# Patient Record
Sex: Male | Born: 1960 | Race: White | Hispanic: No | Marital: Married | Smoking: Never smoker
Health system: Southern US, Community
[De-identification: ages and names within clinical notes are randomized; demographics above are authoritative.]

## PROBLEM LIST (undated history)

## (undated) DIAGNOSIS — I10 Essential (primary) hypertension: Secondary | ICD-10-CM

## (undated) DIAGNOSIS — R519 Headache, unspecified: Secondary | ICD-10-CM

## (undated) DIAGNOSIS — I2510 Atherosclerotic heart disease of native coronary artery without angina pectoris: Secondary | ICD-10-CM

## (undated) DIAGNOSIS — K219 Gastro-esophageal reflux disease without esophagitis: Secondary | ICD-10-CM

## (undated) DIAGNOSIS — M1990 Unspecified osteoarthritis, unspecified site: Secondary | ICD-10-CM

## (undated) DIAGNOSIS — E785 Hyperlipidemia, unspecified: Secondary | ICD-10-CM

## (undated) DIAGNOSIS — C61 Malignant neoplasm of prostate: Secondary | ICD-10-CM

## (undated) DIAGNOSIS — I499 Cardiac arrhythmia, unspecified: Secondary | ICD-10-CM

## (undated) HISTORY — DX: Hyperlipidemia, unspecified: E78.5

## (undated) HISTORY — PX: OTHER SURGICAL HISTORY: SHX169

## (undated) HISTORY — PX: WISDOM TOOTH EXTRACTION: SHX21

## (undated) HISTORY — PX: PROSTATE BIOPSY: SHX241

## (undated) HISTORY — DX: Atherosclerotic heart disease of native coronary artery without angina pectoris: I25.10

---

## 2003-03-04 ENCOUNTER — Encounter: Payer: Self-pay | Admitting: Family Medicine

## 2003-03-04 ENCOUNTER — Ambulatory Visit (HOSPITAL_COMMUNITY): Admission: RE | Admit: 2003-03-04 | Discharge: 2003-03-04 | Payer: Self-pay | Admitting: Family Medicine

## 2008-12-29 ENCOUNTER — Encounter: Admission: RE | Admit: 2008-12-29 | Discharge: 2008-12-29 | Payer: Self-pay | Admitting: Family Medicine

## 2009-12-28 ENCOUNTER — Encounter: Admission: RE | Admit: 2009-12-28 | Discharge: 2009-12-28 | Payer: Self-pay | Admitting: Family Medicine

## 2013-12-25 ENCOUNTER — Emergency Department (HOSPITAL_COMMUNITY)
Admission: EM | Admit: 2013-12-25 | Discharge: 2013-12-25 | Disposition: A | Discharge status: Home / Self Care | Payer: BC Managed Care – PPO | Attending: Emergency Medicine | Admitting: Emergency Medicine

## 2013-12-25 ENCOUNTER — Emergency Department (HOSPITAL_COMMUNITY): Payer: BC Managed Care – PPO

## 2013-12-25 ENCOUNTER — Encounter (HOSPITAL_COMMUNITY): Payer: Self-pay | Admitting: Emergency Medicine

## 2013-12-25 DIAGNOSIS — S0100XA Unspecified open wound of scalp, initial encounter: Secondary | ICD-10-CM | POA: Insufficient documentation

## 2013-12-25 DIAGNOSIS — Z79899 Other long term (current) drug therapy: Secondary | ICD-10-CM | POA: Insufficient documentation

## 2013-12-25 DIAGNOSIS — Z23 Encounter for immunization: Secondary | ICD-10-CM | POA: Insufficient documentation

## 2013-12-25 DIAGNOSIS — Y929 Unspecified place or not applicable: Secondary | ICD-10-CM | POA: Insufficient documentation

## 2013-12-25 DIAGNOSIS — R112 Nausea with vomiting, unspecified: Secondary | ICD-10-CM | POA: Insufficient documentation

## 2013-12-25 DIAGNOSIS — Y939 Activity, unspecified: Secondary | ICD-10-CM | POA: Insufficient documentation

## 2013-12-25 DIAGNOSIS — W208XXA Other cause of strike by thrown, projected or falling object, initial encounter: Secondary | ICD-10-CM | POA: Insufficient documentation

## 2013-12-25 DIAGNOSIS — I10 Essential (primary) hypertension: Secondary | ICD-10-CM | POA: Insufficient documentation

## 2013-12-25 DIAGNOSIS — S0101XA Laceration without foreign body of scalp, initial encounter: Secondary | ICD-10-CM

## 2013-12-25 HISTORY — DX: Cardiac arrhythmia, unspecified: I49.9

## 2013-12-25 HISTORY — DX: Essential (primary) hypertension: I10

## 2013-12-25 MED ORDER — OXYCODONE-ACETAMINOPHEN 5-325 MG PO TABS: 2.0000 | ORAL_TABLET | ORAL | Status: DC | PRN

## 2013-12-25 MED ORDER — MORPHINE SULFATE 4 MG/ML IJ SOLN
4.0000 mg | Freq: Once | INTRAMUSCULAR | Status: AC
  Administered 2013-12-25: 4 mg via INTRAVENOUS
  Filled 2013-12-25: qty 1

## 2013-12-25 MED ORDER — TETANUS-DIPHTHERIA TOXOIDS TD 5-2 LFU IM INJ
0.5000 mL | INJECTION | Freq: Once | INTRAMUSCULAR | Status: AC
  Administered 2013-12-25: 0.5 mL via INTRAMUSCULAR
  Filled 2013-12-25: qty 0.5

## 2013-12-25 MED ORDER — ONDANSETRON HCL 4 MG/2ML IJ SOLN
4.0000 mg | Freq: Once | INTRAMUSCULAR | Status: AC
  Administered 2013-12-25: 4 mg via INTRAVENOUS
  Filled 2013-12-25: qty 2

## 2013-12-25 MED ORDER — SODIUM CHLORIDE 0.9 % IV SOLN: INTRAVENOUS | Status: DC

## 2013-12-25 MED ORDER — SODIUM CHLORIDE 0.9 % IV BOLUS (SEPSIS)
1000.0000 mL | Freq: Once | INTRAVENOUS | Status: AC
  Administered 2013-12-25: 1000 mL via INTRAVENOUS

## 2013-12-25 MED ORDER — METHOCARBAMOL 500 MG PO TABS: 500.0000 mg | ORAL_TABLET | Freq: Two times a day (BID) | ORAL | Status: DC

## 2013-12-25 NOTE — ED Notes (Signed)
Per EMS, pt was hit in head with tree limb. Laceration with skull showing, bleeding controlled at this time. No LOC, alert and oriented x4.

## 2013-12-25 NOTE — ED Provider Notes (Signed)
LACERATION REPAIR Performed by: Sherrie George Consent: Verbal consent obtained. Risks and benefits: risks, benefits and alternatives were discussed Patient identity confirmed: provided demographic data Time out performed prior to procedure Prepped and Draped in normal sterile fashion Wound explored Laceration Location: LEFT scalp and Calvarium  Laceration Length: 14cm No Foreign Bodies seen or palpated Anesthesia: local infiltration Local anesthetic: lidocaine 2% w/ epinephrine Anesthetic total: 15 ml Irrigation method: syringe Amount of cleaning: copious  Calvarium closure: 4.0 Vicryl x 6 simple interrupted sutures  Skin closure: Staples Number of sutures or staples: 21 staples Patient tolerance: Patient tolerated the procedure well with no immediate complications.       Sherrie George, PA-C 12/25/13 [phone]

## 2013-12-25 NOTE — Discharge Instructions (Signed)
Have staples removed in 7-10 days Staple Wound Closure Staples are used to help a wound heal faster by holding the edges of the wound together. HOME CARE  Keep the area around the staples clean and dry.  Rest and raise (elevate) the injured part above the level of your heart.  See your doctor for a follow-up check of the wound.  See your doctor to have the staples removed.  Clean the wound daily with water.  Do not soak the wound in water for long periods of time.  Let air reach the wound as it heals. GET HELP RIGHT AWAY IF:  Head Injury, Adult You have received a head injury. It does not appear serious at this time. Headaches and vomiting are common following head injury. It should be easy to awaken from sleeping. Sometimes it is necessary for you to stay in the emergency department for a while for observation. Sometimes admission to the hospital may be needed. After injuries such as yours, most problems occur within the first 24 hours, but side effects may occur up to 7 10 days after the injury. It is important for you to carefully monitor your condition and contact your health care provider or seek immediate medical care if there is a change in your condition. WHAT ARE THE TYPES OF HEAD INJURIES? Head injuries can be as minor as a bump. Some head injuries can be more severe. More severe head injuries include:  A jarring injury to the brain (concussion).  A bruise of the brain (contusion). This mean there is bleeding in the brain that can cause swelling.  A cracked skull (skull fracture).  Bleeding in the brain that collects, clots, and forms a bump (hematoma). WHAT CAUSES A HEAD INJURY? A serious head injury is most likely to happen to someone who is in a car wreck and is not wearing a seat belt. Other causes of major head injuries include bicycle or motorcycle accidents, sports injuries, and falls. HOW ARE HEAD INJURIES DIAGNOSED? A complete history of the event leading to the  injury and your current symptoms will be helpful in diagnosing head injuries. Many times, pictures of the brain, such as CT or MRI are needed to see the extent of the injury. Often, an overnight hospital stay is necessary for observation.  WHEN SHOULD I SEEK IMMEDIATE MEDICAL CARE?  You should get help right away if:  You have confusion or drowsiness.  You feel sick to your stomach (nauseous) or have continued, forceful vomiting.  You have dizziness or unsteadiness that is getting worse.  You have severe, continued headaches not relieved by medicine. Only take over-the-counter or prescription medicines for pain, fever, or discomfort as directed by your health care provider.  You do not have normal function of the arms or legs or are unable to walk.  You notice changes in the black spots in the center of the colored part of your eye (pupil).  You have a clear or bloody fluid coming from your nose or ears.  You have a loss of vision. During the next 24 hours after the injury, you must stay with someone who can watch you for the warning signs. This person should contact local emergency services (911 in the [place] if you have seizures, you become unconscious, or you are unable to wake up. HOW CAN I PREVENT A HEAD INJURY IN THE FUTURE? The most important factor for preventing major head injuries is avoiding motor vehicle accidents. To minimize the potential for damage  to your head, it is crucial to wear seat belts while riding in motor vehicles. Wearing helmets while bike riding and playing collision sports (like football) is also helpful. Also, avoiding dangerous activities around the house will further help reduce your risk of head injury.  WHEN CAN I RETURN TO NORMAL ACTIVITIES AND ATHLETICS? You should be reevaluated by your health care provider before returning to these activities. If you have any of the following symptoms, you should not return to activities or contact sports until 1 week  after the symptoms have stopped:  Persistent headache.  Dizziness or vertigo.  Poor attention and concentration.  Confusion.  Memory problems.  Nausea or vomiting.  Fatigue or tire easily.  Irritability.  Intolerant of bright lights or loud noises.  Anxiety or depression.  Disturbed sleep. MAKE SURE YOU:   Understand these instructions.  Will watch your condition.  Will get help right away if you are not doing well or get worse. Document Released: 10/07/2005 Document Revised: 07/28/2013 Document Reviewed: 06/14/2013 [facility] Patient Information 2014 [place].   You have redness or puffiness around the wound.  You have a red line going away from the wound.  You have more pain or tenderness.  You have yellowish-white fluid (pus) coming from the wound.  Your wound does not stay together after the staples have been taken out.  You see something coming out of the wound, such as wood or glass.  You have problems moving the injured area.  You have a fever or lasting symptoms for more than 2-3 days.  You have a fever and your symptoms suddenly get worse. MAKE SURE YOU:   Understand these instructions.  Will watch this condition.  Will get help right away if you are not doing well or get worse. Document Released: 07/16/2008 Document Revised: 07/01/2012 Document Reviewed: 04/19/2012 [facility] Patient Information 2014 [place].

## 2013-12-25 NOTE — ED Notes (Signed)
MD at bedside. 

## 2013-12-25 NOTE — ED Provider Notes (Signed)
CSN: [id number]     Arrival date & time 12/25/13  1546 History   First MD Initiated Contact with Patient 12/25/13 1556     Chief Complaint  Patient presents with  . Head Laceration     (Consider location/radiation/quality/duration/timing/severity/associated sxs/prior Treatment) Patient is a 53 y.o. male presenting with scalp laceration. The history is provided by the patient and the spouse.  Head Laceration   patient here complaining of left parietal laceration to scalp after being struck by a tree branch. No loss of consciousness. Complains of bleeding and pain characterized as sharp and worse with movement. Denies any weakness in his upper lower extremities. Denies any chest abdomen or pelvis pain. Denies any focal neurological deficits. No blurred vision. He has had nausea and vomiting since the accident. Denies visual changes. EMS was called and direct pressure bandage was placed and he was transported here.  Past Medical History  Diagnosis Date  . Hypertension   . Irregular heart beat    History reviewed. No pertinent past surgical history. No family history on file. History  Substance Use Topics  . Smoking status: Never Smoker   . Smokeless tobacco: Not on file  . Alcohol Use: No    Review of Systems  All other systems reviewed and are negative.      Allergies  Review of patient's allergies indicates not on file.  Home Medications   Current Outpatient Rx  Name  Route  Sig  Dispense  Refill  . atorvastatin (LIPITOR) 20 MG tablet   Oral   Take 20 mg by mouth daily.         . Chlorphen-Pseudoephed-APAP (TYLENOL ALLERGY SINUS PO)   Oral   Take 1 tablet by mouth every 4 (four) hours as needed (allergies).         Marland Kitchen glucosamine-chondroitin 500-400 MG tablet   Oral   Take 1 tablet by mouth daily.         Marland Kitchen losartan-hydrochlorothiazide (HYZAAR) 50-12.5 MG per tablet   Oral   Take 1 tablet by mouth daily.          BP 149/85  Pulse 83  Resp 16  SpO2  96% Physical Exam  Nursing note and vitals reviewed. Constitutional: He is oriented to person, place, and time. He appears well-developed and well-nourished.  Non-toxic appearance. No distress.  HENT:  Head: Normocephalic and atraumatic.    Eyes: Conjunctivae, EOM and lids are normal. Pupils are equal, round, and reactive to light.  Neck: Normal range of motion. Neck supple. No spinous process tenderness and no muscular tenderness present. No tracheal deviation present. No mass present.  Cardiovascular: Normal rate, regular rhythm and normal heart sounds.  Exam reveals no gallop.   No murmur heard. Pulmonary/Chest: Effort normal and breath sounds normal. No stridor. No respiratory distress. He has no decreased breath sounds. He has no wheezes. He has no rhonchi. He has no rales.  Abdominal: Soft. Normal appearance and bowel sounds are normal. He exhibits no distension. There is no tenderness. There is no rebound and no CVA tenderness.  Musculoskeletal: Normal range of motion. He exhibits no edema and no tenderness.  Neurological: He is alert and oriented to person, place, and time. He has normal strength. No cranial nerve deficit or sensory deficit. GCS eye subscore is 4. GCS verbal subscore is 5. GCS motor subscore is 6.  Skin: Skin is warm and dry. No abrasion and no rash noted.  Psychiatric: He has a normal mood and affect. His  speech is normal and behavior is normal.    ED Course  Procedures (including critical care time) Labs Review Labs Reviewed - No data to display Imaging Review No results found.   EKG Interpretation None      MDM   Final diagnoses:  None    Patient given tetanus up-to-date. Was given Zofran and morphine for his pain and nausea. He feels better. Repeat neurological exams remained stable. Laceration repaired by physician assistant please see his note. Patient to be discharged with prescription for pain and instructions on staple removal.    Leota Jacobsen, MD 12/25/13 [phone]

## 2013-12-26 NOTE — ED Provider Notes (Signed)
Medical screening examination/treatment/procedure(s) were conducted as a shared visit with non-physician practitioner(s) and myself.  I personally evaluated the patient during the encounter.   EKG Interpretation None       Leota Jacobsen, MD 12/26/13 [phone]

## 2015-03-09 ENCOUNTER — Encounter: Payer: Self-pay | Admitting: *Deleted

## 2015-03-13 ENCOUNTER — Encounter: Payer: Self-pay | Admitting: Interventional Cardiology

## 2015-03-13 ENCOUNTER — Ambulatory Visit (INDEPENDENT_AMBULATORY_CARE_PROVIDER_SITE_OTHER): Payer: BLUE CROSS/BLUE SHIELD | Admitting: Interventional Cardiology

## 2015-03-13 VITALS — BP 132/86 | HR 69 | Ht 68.0 in | Wt 168.0 lb

## 2015-03-13 DIAGNOSIS — I491 Atrial premature depolarization: Secondary | ICD-10-CM | POA: Diagnosis not present

## 2015-03-13 DIAGNOSIS — E785 Hyperlipidemia, unspecified: Secondary | ICD-10-CM

## 2015-03-13 DIAGNOSIS — R002 Palpitations: Secondary | ICD-10-CM

## 2015-03-13 DIAGNOSIS — I10 Essential (primary) hypertension: Secondary | ICD-10-CM

## 2015-03-13 DIAGNOSIS — R0789 Other chest pain: Secondary | ICD-10-CM | POA: Diagnosis not present

## 2015-03-13 NOTE — Patient Instructions (Signed)
Medication Instructions:  Same  Labwork: None  Testing/Procedures: Your physician has requested that you have an exercise tolerance test. For further information please visit [email or web address]. Please also follow instruction sheet, as given.  Follow-Up: To be determined after Treadmill test.

## 2015-03-13 NOTE — Progress Notes (Signed)
Patient ID: Stephen Ayers, male   DOB: 1960/11/26, 54 y.o.   MRN: [id number]     Cardiology Office Note   Date:  03/13/2015   ID:  Stephen Ayers, DOB February 13, 1961, MRN [id number]  PCP:  Stephen Frees, MD    No chief complaint on file. chest pain   Wt Readings from Last 3 Encounters:  03/13/15 168 lb (76.204 kg)       History of Present Illness: Stephen Ayers is a 54 y.o. male  Who has had HTN, hyperlipidemia and symptomatic PACs, several times a day.  He has had palpitations that last a few seconds causing lightheadedness and fatigue.  No syncope. He is not close to passing out.  He has had some left sided chest pain, not related to exertion.  Walks up stairs without problems.  THere is some tingling in the left wrist and occasional jaw pain.  CP is frequent, several times a week.  It lasts several hours at a time.  THere is a lot of stress at work and this seems to contribute.  He is taking ecotrin. No bleeding issues.  Biggest concern is the chest pain that leaves him tired.  Father had CABG at age 57.  He dies at 26.  He stopped cardio exercises 4-5 weeks ago due to these sx.  He has had PACs with exercise in the past.     Past Medical History  Diagnosis Date  . Hypertension   . Irregular heart beat     History reviewed. No pertinent past surgical history.   Current Outpatient Prescriptions  Medication Sig Dispense Refill  . Aspirin (ECOTRIN PO) Take by mouth daily.    Marland Kitchen atorvastatin (LIPITOR) 20 MG tablet Take 20 mg by mouth daily.    . Chlorphen-Pseudoephed-APAP (TYLENOL ALLERGY SINUS PO) Take 1 tablet by mouth every 4 (four) hours as needed (allergies).    Marland Kitchen glucosamine-chondroitin 500-400 MG tablet Take 1 tablet by mouth daily.    Marland Kitchen losartan-hydrochlorothiazide (HYZAAR) 50-12.5 MG per tablet Take 1 tablet by mouth daily.    . methocarbamol (ROBAXIN) 500 MG tablet Take 1 tablet (500 mg total) by mouth 2 (two) times daily. 20 tablet 0   No current  facility-administered medications for this visit.    Allergies:   Review of patient's allergies indicates no known allergies.    Social History:  The patient  reports that he has never smoked. He does not have any smokeless tobacco history on file. He reports that he does not drink alcohol or use illicit drugs.   Family History:  The patient's *family history includes Kidney failure in his father; Mitral valve prolapse in his mother.    ROS:  Please see the history of present illness.   Otherwise, review of systems are positive for chest pain.   All other systems are reviewed and negative.    PHYSICAL EXAM: VS:  BP 132/86 mmHg  Pulse 69  Ht 5\' 8"  (1.727 m)  Wt 168 lb (76.204 kg)  BMI 25.55 kg/m2 , BMI Body mass index is 25.55 kg/(m^2). GEN: Well nourished, well developed, in no acute distress HEENT: normal Neck: no JVD, carotid bruits, or masses Cardiac: *RRR; no murmurs, rubs, or gallops,no edema  Respiratory:  clear to auscultation bilaterally, normal work of breathing GI: soft, nontender, nondistended, + BS MS: no deformity or atrophy Skin: warm and dry, no rash Neuro:  Strength and sensation are intact Psych: euthymic mood, full affect   EKG:  The ekg ordered today demonstrates normal   Recent Labs: No results found for requested labs within last 365 days.   Lipid Panel No results found for: CHOL, TRIG, HDL, CHOLHDL, VLDL, LDLCALC, LDLDIRECT   Other studies Reviewed: Additional studies/ records that were reviewed today with results demonstrating: Exercise treadmill test in 2013 showed no ischemia. He walked for 9 minutes. No arrhythmia. Echocardiogram in 2011 showed normal LV function.  The echo was done at [facility].   ASSESSMENT AND PLAN:  1. Chest pain: Several atypical features including the fact that it is nonexertional. He walks stairs without any problems. His prior treadmill test in 2013 was negative for ischemia.  Plan for exercise treadmill  test. 2. Palpitations: He has had PACs in the past. Now having a few seconds of sustained palpitations. If his symptoms persist after his treadmill test, will plan for a 30 day event monitor. 3. Hyperlipidemia: Continue atorvastatin. 4. Hypertension: Continue current medicines. Well controlled.   Current medicines are reviewed at length with the patient today.  The patient concerns regarding his medicines were addressed.  The following changes have been made:  No change  Labs/ tests ordered today include: none No orders of the defined types were placed in this encounter.    Recommend 150 minutes/week of aerobic exercise Low fat, low carb, high fiber diet recommended  Disposition:   FU for ETT   Teresita Madura., MD  03/13/2015 12:33 PM    [facility] [place], [place], [place]  [address] Phone: [phone]; Fax: [phone]

## 2015-04-06 ENCOUNTER — Telehealth (HOSPITAL_COMMUNITY): Payer: Self-pay

## 2015-04-06 NOTE — Telephone Encounter (Signed)
Encounter complete. 

## 2015-04-07 ENCOUNTER — Telehealth (HOSPITAL_COMMUNITY): Payer: Self-pay

## 2015-04-07 NOTE — Telephone Encounter (Signed)
Encounter complete. 

## 2015-04-11 ENCOUNTER — Ambulatory Visit (HOSPITAL_COMMUNITY)
Admission: RE | Admit: 2015-04-11 | Discharge: 2015-04-11 | Disposition: A | Discharge status: Home / Self Care | Payer: BLUE CROSS/BLUE SHIELD | Source: Ambulatory Visit | Attending: Cardiovascular Disease | Admitting: Cardiovascular Disease

## 2015-04-11 DIAGNOSIS — R0789 Other chest pain: Secondary | ICD-10-CM | POA: Diagnosis present

## 2015-04-12 LAB — EXERCISE TOLERANCE TEST
CHL CUP MPHR: 164 {beats}/min
CSEPED: 12 min
CSEPEW: 13.7 METS
CSEPPHR: 187 {beats}/min
Exercise duration (sec): 1 s
Percent HR: 114 %
RPE: 14
Rest HR: 71 {beats}/min

## 2015-04-14 ENCOUNTER — Encounter: Payer: Self-pay | Admitting: Interventional Cardiology

## 2016-02-26 DIAGNOSIS — N401 Enlarged prostate with lower urinary tract symptoms: Secondary | ICD-10-CM | POA: Diagnosis not present

## 2016-02-26 DIAGNOSIS — N4889 Other specified disorders of penis: Secondary | ICD-10-CM | POA: Diagnosis not present

## 2016-02-26 DIAGNOSIS — R3914 Feeling of incomplete bladder emptying: Secondary | ICD-10-CM | POA: Diagnosis not present

## 2016-02-26 DIAGNOSIS — R3912 Poor urinary stream: Secondary | ICD-10-CM | POA: Diagnosis not present

## 2016-02-26 DIAGNOSIS — Z0000 Encounter for general adult medical examination without abnormal findings: Secondary | ICD-10-CM | POA: Diagnosis not present

## 2016-04-03 DIAGNOSIS — N410 Acute prostatitis: Secondary | ICD-10-CM | POA: Diagnosis not present

## 2016-04-12 DIAGNOSIS — L570 Actinic keratosis: Secondary | ICD-10-CM | POA: Diagnosis not present

## 2016-06-03 DIAGNOSIS — M25512 Pain in left shoulder: Secondary | ICD-10-CM | POA: Diagnosis not present

## 2016-06-03 DIAGNOSIS — I10 Essential (primary) hypertension: Secondary | ICD-10-CM | POA: Diagnosis not present

## 2016-06-03 DIAGNOSIS — Z125 Encounter for screening for malignant neoplasm of prostate: Secondary | ICD-10-CM | POA: Diagnosis not present

## 2016-06-03 DIAGNOSIS — E782 Mixed hyperlipidemia: Secondary | ICD-10-CM | POA: Diagnosis not present

## 2016-09-30 DIAGNOSIS — N401 Enlarged prostate with lower urinary tract symptoms: Secondary | ICD-10-CM | POA: Diagnosis not present

## 2016-11-04 DIAGNOSIS — M25511 Pain in right shoulder: Secondary | ICD-10-CM | POA: Diagnosis not present

## 2016-11-13 DIAGNOSIS — M19011 Primary osteoarthritis, right shoulder: Secondary | ICD-10-CM | POA: Diagnosis not present

## 2016-11-13 DIAGNOSIS — M19012 Primary osteoarthritis, left shoulder: Secondary | ICD-10-CM | POA: Diagnosis not present

## 2016-11-18 DIAGNOSIS — R972 Elevated prostate specific antigen [PSA]: Secondary | ICD-10-CM | POA: Diagnosis not present

## 2016-11-18 DIAGNOSIS — R3911 Hesitancy of micturition: Secondary | ICD-10-CM | POA: Diagnosis not present

## 2016-11-18 DIAGNOSIS — R3912 Poor urinary stream: Secondary | ICD-10-CM | POA: Diagnosis not present

## 2016-11-18 DIAGNOSIS — N401 Enlarged prostate with lower urinary tract symptoms: Secondary | ICD-10-CM | POA: Diagnosis not present

## 2016-12-04 DIAGNOSIS — Z23 Encounter for immunization: Secondary | ICD-10-CM | POA: Diagnosis not present

## 2016-12-04 DIAGNOSIS — E782 Mixed hyperlipidemia: Secondary | ICD-10-CM | POA: Diagnosis not present

## 2016-12-04 DIAGNOSIS — I10 Essential (primary) hypertension: Secondary | ICD-10-CM | POA: Diagnosis not present

## 2017-02-12 DIAGNOSIS — L570 Actinic keratosis: Secondary | ICD-10-CM | POA: Diagnosis not present

## 2017-05-13 DIAGNOSIS — L570 Actinic keratosis: Secondary | ICD-10-CM | POA: Diagnosis not present

## 2017-06-03 DIAGNOSIS — Z125 Encounter for screening for malignant neoplasm of prostate: Secondary | ICD-10-CM | POA: Diagnosis not present

## 2017-06-03 DIAGNOSIS — I10 Essential (primary) hypertension: Secondary | ICD-10-CM | POA: Diagnosis not present

## 2017-06-03 DIAGNOSIS — E782 Mixed hyperlipidemia: Secondary | ICD-10-CM | POA: Diagnosis not present

## 2017-07-07 DIAGNOSIS — R972 Elevated prostate specific antigen [PSA]: Secondary | ICD-10-CM | POA: Diagnosis not present

## 2017-07-07 DIAGNOSIS — R399 Unspecified symptoms and signs involving the genitourinary system: Secondary | ICD-10-CM | POA: Diagnosis not present

## 2017-08-06 DIAGNOSIS — Z23 Encounter for immunization: Secondary | ICD-10-CM | POA: Diagnosis not present

## 2017-09-04 DIAGNOSIS — Z125 Encounter for screening for malignant neoplasm of prostate: Secondary | ICD-10-CM | POA: Diagnosis not present

## 2017-10-03 DIAGNOSIS — L570 Actinic keratosis: Secondary | ICD-10-CM | POA: Diagnosis not present

## 2017-12-04 DIAGNOSIS — E782 Mixed hyperlipidemia: Secondary | ICD-10-CM | POA: Diagnosis not present

## 2017-12-04 DIAGNOSIS — I10 Essential (primary) hypertension: Secondary | ICD-10-CM | POA: Diagnosis not present

## 2018-02-20 DIAGNOSIS — T162XXA Foreign body in left ear, initial encounter: Secondary | ICD-10-CM | POA: Diagnosis not present

## 2018-02-20 DIAGNOSIS — H6122 Impacted cerumen, left ear: Secondary | ICD-10-CM | POA: Diagnosis not present

## 2018-02-20 DIAGNOSIS — H9112 Presbycusis, left ear: Secondary | ICD-10-CM | POA: Diagnosis not present

## 2018-06-03 DIAGNOSIS — B356 Tinea cruris: Secondary | ICD-10-CM | POA: Diagnosis not present

## 2018-06-03 DIAGNOSIS — Z125 Encounter for screening for malignant neoplasm of prostate: Secondary | ICD-10-CM | POA: Diagnosis not present

## 2018-06-03 DIAGNOSIS — Z0000 Encounter for general adult medical examination without abnormal findings: Secondary | ICD-10-CM | POA: Diagnosis not present

## 2018-06-03 DIAGNOSIS — E782 Mixed hyperlipidemia: Secondary | ICD-10-CM | POA: Diagnosis not present

## 2018-06-03 DIAGNOSIS — N401 Enlarged prostate with lower urinary tract symptoms: Secondary | ICD-10-CM | POA: Diagnosis not present

## 2018-06-03 DIAGNOSIS — I10 Essential (primary) hypertension: Secondary | ICD-10-CM | POA: Diagnosis not present

## 2018-07-30 DIAGNOSIS — Z23 Encounter for immunization: Secondary | ICD-10-CM | POA: Diagnosis not present

## 2018-08-19 DIAGNOSIS — L570 Actinic keratosis: Secondary | ICD-10-CM | POA: Diagnosis not present

## 2018-12-04 DIAGNOSIS — I10 Essential (primary) hypertension: Secondary | ICD-10-CM | POA: Diagnosis not present

## 2018-12-04 DIAGNOSIS — R972 Elevated prostate specific antigen [PSA]: Secondary | ICD-10-CM | POA: Diagnosis not present

## 2018-12-04 DIAGNOSIS — B356 Tinea cruris: Secondary | ICD-10-CM | POA: Diagnosis not present

## 2018-12-04 DIAGNOSIS — Z23 Encounter for immunization: Secondary | ICD-10-CM | POA: Diagnosis not present

## 2018-12-04 DIAGNOSIS — L570 Actinic keratosis: Secondary | ICD-10-CM | POA: Diagnosis not present

## 2019-06-07 DIAGNOSIS — Z0000 Encounter for general adult medical examination without abnormal findings: Secondary | ICD-10-CM | POA: Diagnosis not present

## 2019-06-07 DIAGNOSIS — E782 Mixed hyperlipidemia: Secondary | ICD-10-CM | POA: Diagnosis not present

## 2019-06-07 DIAGNOSIS — N401 Enlarged prostate with lower urinary tract symptoms: Secondary | ICD-10-CM | POA: Diagnosis not present

## 2019-06-07 DIAGNOSIS — I10 Essential (primary) hypertension: Secondary | ICD-10-CM | POA: Diagnosis not present

## 2019-07-14 DIAGNOSIS — M25551 Pain in right hip: Secondary | ICD-10-CM | POA: Diagnosis not present

## 2019-07-14 DIAGNOSIS — M5431 Sciatica, right side: Secondary | ICD-10-CM | POA: Diagnosis not present

## 2019-07-23 DIAGNOSIS — M25551 Pain in right hip: Secondary | ICD-10-CM | POA: Diagnosis not present

## 2019-07-26 DIAGNOSIS — M25551 Pain in right hip: Secondary | ICD-10-CM | POA: Diagnosis not present

## 2019-07-26 DIAGNOSIS — M1611 Unilateral primary osteoarthritis, right hip: Secondary | ICD-10-CM | POA: Diagnosis not present

## 2019-08-04 DIAGNOSIS — M25551 Pain in right hip: Secondary | ICD-10-CM | POA: Diagnosis not present

## 2019-08-11 DIAGNOSIS — M25551 Pain in right hip: Secondary | ICD-10-CM | POA: Diagnosis not present

## 2019-08-18 DIAGNOSIS — R972 Elevated prostate specific antigen [PSA]: Secondary | ICD-10-CM | POA: Diagnosis not present

## 2019-08-18 DIAGNOSIS — N401 Enlarged prostate with lower urinary tract symptoms: Secondary | ICD-10-CM | POA: Diagnosis not present

## 2019-08-18 DIAGNOSIS — R350 Frequency of micturition: Secondary | ICD-10-CM | POA: Diagnosis not present

## 2019-09-09 DIAGNOSIS — M25551 Pain in right hip: Secondary | ICD-10-CM | POA: Insufficient documentation

## 2019-09-13 DIAGNOSIS — M1611 Unilateral primary osteoarthritis, right hip: Secondary | ICD-10-CM | POA: Diagnosis not present

## 2019-09-13 DIAGNOSIS — M25551 Pain in right hip: Secondary | ICD-10-CM | POA: Diagnosis not present

## 2019-10-07 DIAGNOSIS — R972 Elevated prostate specific antigen [PSA]: Secondary | ICD-10-CM | POA: Diagnosis not present

## 2019-10-07 DIAGNOSIS — C61 Malignant neoplasm of prostate: Secondary | ICD-10-CM | POA: Diagnosis not present

## 2019-11-04 DIAGNOSIS — C61 Malignant neoplasm of prostate: Secondary | ICD-10-CM | POA: Diagnosis not present

## 2019-11-09 ENCOUNTER — Encounter: Payer: Self-pay | Admitting: *Deleted

## 2019-11-22 ENCOUNTER — Encounter: Payer: Self-pay | Admitting: Radiation Oncology

## 2019-11-22 NOTE — Progress Notes (Signed)
GU Location of Tumor / Histology: prostatic adenocarcinoma  If Prostate Cancer, Gleason Score is (3 + 4) and PSA is (5.98). Prostate volume: 33 grams.  Stephen Ayers was referred back to Dr. Jeffie Pollock by Dr. Kenton Kingfisher for PSA of 6.27 on recent labs. He was seen in 2017 and the PSA was 3.3 in May 2017 but came down to 2.29 later that year.  Biopsies of prostate (if applicable) revealed:   Past/Anticipated interventions by urology, if any: Tamsulosin since 2017, prostate biopsy, referral to Dr. Tammi Klippel  Past/Anticipated interventions by medical oncology, if any: no  Weight changes, if any: yes, from not working out    Bowel/Bladder complaints, if any: IPSS 4. SHIM 8. Reports some daytime frequency, nocturia x 1, denies dysuria or hematuria. Denies urinary leakage or incontinence.Denies any bowel complaints.  Nausea/Vomiting, if any: no  Pain issues, if any:  Right hip pain x few month. MRI revealed arthritis. Also, reports right shoulder pain.   SAFETY ISSUES:  Prior radiation? denies  Pacemaker/ICD? denies  Possible current pregnancy? no, male patient  Is the patient on methotrexate? no  Current Complaints / other details:  59 year old male. Single.

## 2019-11-23 ENCOUNTER — Other Ambulatory Visit: Payer: Self-pay

## 2019-11-23 ENCOUNTER — Ambulatory Visit
Admission: RE | Admit: 2019-11-23 | Discharge: 2019-11-23 | Disposition: A | Discharge status: Home / Self Care | Payer: BC Managed Care – PPO | Source: Ambulatory Visit | Attending: Radiation Oncology | Admitting: Radiation Oncology

## 2019-11-23 ENCOUNTER — Encounter: Payer: Self-pay | Admitting: Radiation Oncology

## 2019-11-23 VITALS — Ht 67.0 in | Wt 170.0 lb

## 2019-11-23 DIAGNOSIS — R972 Elevated prostate specific antigen [PSA]: Secondary | ICD-10-CM | POA: Diagnosis not present

## 2019-11-23 DIAGNOSIS — C61 Malignant neoplasm of prostate: Secondary | ICD-10-CM | POA: Insufficient documentation

## 2019-11-23 HISTORY — DX: Malignant neoplasm of prostate: C61

## 2019-11-23 NOTE — Progress Notes (Signed)
Radiation Oncology         [phone] ________________________________  Initial outpatient Consultation - Conducted via Telephone due to current COVID-19 concerns for limiting patient exposure  Name: Stephen Ayers MRN: [id number]  Date: 11/23/2019  DOB: [birth year]  [id number], Gwyndolyn Saxon, MD  Irine Seal, MD   REFERRING PHYSICIAN: Irine Seal, MD  DIAGNOSIS: 59 y.o. gentleman with Stage T1c adenocarcinoma of the prostate with Gleason score of 3+4, and PSA of 5.98.    ICD-10-CM   1. Malignant neoplasm of prostate ([place])  C61     HISTORY OF PRESENT ILLNESS: Stephen Ayers is a 59 y.o. male with a diagnosis of prostate cancer. He has a history of elevated PSA at 3.3, associated with prostatitis and BPH with BOO, initially seen with Dr. Jeffie Pollock at [facility] in 2017 and prescribed tamsulosin. His PSA decreased back to normal at 2.29 with treatment so he did not warrant any further evaluation and has continued in routine follow up with his PCP since that time.  More recently, he was noted to have an elevated PSA of 6.27 by his primary care physician, Dr. Kenton Kingfisher.  Accordingly, he was referred back for evaluation in urology by Dr. Jeffie Pollock on 08/18/2019,  digital rectal examination was performed at that time revealing no nodules. A repeat PSA performed at that visit remained elevated at 5.98. Therefore, the patient proceeded to transrectal ultrasound with 12 biopsies of the prostate on 10/07/2019.  The prostate volume measured 33 cc.  Out of 12 core biopsies, 6 were positive.  The maximum Gleason score was 3+4, and this was seen in the right apex. Additionally, Gleason 3+3 was seen in the right apex lateral (small focus), left mid, left mid lateral (small focus), left apex lateral (small focus), and left base lateral.  The patient reviewed the biopsy results with his urologist and he has kindly been referred today for discussion of potential radiation treatment options.   PREVIOUS RADIATION THERAPY:  No  PAST MEDICAL HISTORY:  Past Medical History:  Diagnosis Date  . Hypertension   . Irregular heart beat   . Prostate cancer ([place])       PAST SURGICAL HISTORY: Past Surgical History:  Procedure Laterality Date  . PROSTATE BIOPSY      FAMILY HISTORY:  Family History  Problem Relation Age of Onset  . Kidney failure Father   . Mitral valve prolapse Mother   . Cancer Paternal Uncle        unknown  . Breast cancer Neg Hx   . Prostate cancer Neg Hx   . Colon cancer Neg Hx   . Pancreatic cancer Neg Hx     SOCIAL HISTORY:  Social History   Socioeconomic History  . Marital status: Single    Spouse name: Not on file  . Number of children: Not on file  . Years of education: Not on file  . Highest education level: Not on file  Occupational History  . Not on file  Tobacco Use  . Smoking status: Never Smoker  . Smokeless tobacco: Never Used  Substance and Sexual Activity  . Alcohol use: No  . Drug use: No  . Sexual activity: Yes  Other Topics Concern  . Not on file  Social History Narrative  . Not on file   Social Determinants of Health   Financial Resource Strain:   . Difficulty of Paying Living Expenses: Not on file  Food Insecurity:   . Worried About Charity fundraiser in the  Last Year: Not on file  . Ran Out of Food in the Last Year: Not on file  Transportation Needs:   . Lack of Transportation (Medical): Not on file  . Lack of Transportation (Non-Medical): Not on file  Physical Activity:   . Days of Exercise per Week: Not on file  . Minutes of Exercise per Session: Not on file  Stress:   . Feeling of Stress : Not on file  Social Connections:   . Frequency of Communication with Friends and Family: Not on file  . Frequency of Social Gatherings with Friends and Family: Not on file  . Attends Religious Services: Not on file  . Active Member of Clubs or Organizations: Not on file  . Attends Archivist Meetings: Not on file  . Marital Status: Not  on file  Intimate Partner Violence:   . Fear of Current or Ex-Partner: Not on file  . Emotionally Abused: Not on file  . Physically Abused: Not on file  . Sexually Abused: Not on file    ALLERGIES: Patient has no known allergies.  MEDICATIONS:  Current Outpatient Medications  Medication Sig Dispense Refill  . atorvastatin (LIPITOR) 20 MG tablet Take 20 mg by mouth daily.    . Chlorphen-Pseudoephed-APAP (TYLENOL ALLERGY SINUS PO) Take 1 tablet by mouth every 4 (four) hours as needed (allergies).    . losartan-hydrochlorothiazide (HYZAAR) 50-12.5 MG per tablet Take 1 tablet by mouth daily.    . naproxen (NAPROSYN) 500 MG tablet naproxen 500 mg tablet  TK 1 T PO Q 12 H WF PRN    . tamsulosin (FLOMAX) 0.4 MG CAPS capsule tamsulosin 0.4 mg capsule    . Aspirin (ECOTRIN PO) Take by mouth daily.     No current facility-administered medications for this encounter.    REVIEW OF SYSTEMS:  On review of systems, the patient reports that he is doing well overall. He denies any chest pain, shortness of breath, cough, fevers, chills, night sweats, unintended weight changes. He denies any bowel disturbances, and denies abdominal pain, nausea or vomiting. He denies any new musculoskeletal or joint aches or pains. He does report chronic right hip pain, which was found to be arthritis on recent MRI. His IPSS was 4 on tamsulosin, indicating mostly controlled urinary symptoms. He reports some daytime frequency and nocturia x1. His SHIM was 8, indicating he has moderate to severe erectile dysfunction. A complete review of systems is obtained and is otherwise negative.    PHYSICAL EXAM:  Wt Readings from Last 3 Encounters:  11/23/19 170 lb (77.1 kg)  03/13/15 168 lb (76.2 kg)   Temp Readings from Last 3 Encounters:  12/25/13 98.1 F (36.7 C) (Oral)   BP Readings from Last 3 Encounters:  03/13/15 132/86  12/25/13 125/72   Pulse Readings from Last 3 Encounters:  03/13/15 69  12/25/13 88   Pain  Assessment Pain Score: 1  Pain Frequency: Constant Pain Loc: Hip(related to arthritis and positional right hip and right shoulder)/10  Physical exam not performed in light of telephone consult visit format.   KPS = 100  100 - Normal; no complaints; no evidence of disease. 90   - Able to carry on normal activity; minor signs or symptoms of disease. 80   - Normal activity with effort; some signs or symptoms of disease. 89   - Cares for self; unable to carry on normal activity or to do active work. 60   - Requires occasional assistance, but is able to  care for most of his personal needs. 50   - Requires considerable assistance and frequent medical care. 25   - Disabled; requires special care and assistance. 62   - Severely disabled; hospital admission is indicated although death not imminent. 34   - Very sick; hospital admission necessary; active supportive treatment necessary. 10   - Moribund; fatal processes progressing rapidly. 0     - Dead  Karnofsky DA, Abelmann WH, Craver LS and Burchenal JH [phone]) The use of the nitrogen mustards in the palliative treatment of carcinoma: with particular reference to bronchogenic carcinoma Cancer 1 [phone]  LABORATORY DATA:  No results found for: WBC, HGB, HCT, MCV, PLT No results found for: NA, K, CL, CO2 No results found for: ALT, AST, GGT, ALKPHOS, BILITOT   RADIOGRAPHY: No results found.    IMPRESSION/PLAN: This visit was conducted via Telephone to spare the patient unnecessary potential exposure in the healthcare setting during the current COVID-19 pandemic. 1. 59 y.o. gentleman with Stage T1c adenocarcinoma of the prostate with Gleason Score of 3+4, and PSA of 5.98. We discussed the patient's workup and outlined the nature of prostate cancer in this setting. The patient's T stage, Gleason's score, and PSA put him into the favorable intermediate risk group. Accordingly, he is eligible for a variety of potential treatment options including  brachytherapy, 5.5 weeks of external radiation or prostatectomy. We discussed the available radiation techniques, and focused on the details and logistics and delivery. We discussed and outlined the risks, benefits, short and long-term effects associated with radiotherapy. We discussed the role of SpaceOAR gel in reducing the rectal toxicity associated with radiotherapy.  He was encouraged to ask questions that were answered to his stated satisfaction.  At the end of the conversation, the patient is interested in moving forward with brachytherapy and use of SpaceOAR gel to reduce rectal toxicity from radiotherapy.  We will share our discussion with Dr. Jeffie Pollock and move forward with scheduling his CT [facility] planning appointment in the near future.  The patient will be contacted by Romie Jumper in our office, who will be working closely with him to coordinate OR scheduling and pre and post procedure appointments, in the near future.  We will contact the pharmaceutical rep to ensure that [place] is available at the time of procedure.  He will have a prostate MRI following his post-seed CT SIM to confirm appropriate distribution of the [place].   Given current concerns for patient exposure during the COVID-19 pandemic, this encounter was conducted via telephone. The patient was notified in advance and was offered a MyChart meeting to allow for face to face communication but unfortunately reported that he did not have the appropriate resources/technology to support such a visit and instead preferred to proceed with telephone consult. The patient has given verbal consent for this type of encounter. The time spent during this encounter was 40 minutes. The attendants for this meeting include Tyler Pita MD, Ashlyn Bruning PA-C, [place], and patient, Stephen Ayers. During the encounter, Tyler Pita MD, Ashlyn Bruning PA-C, and scribe, Wilburn Mylar were located at [place].  Patient, Stephen Ayers was located at home.    Nicholos Johns, PA-C    Tyler Pita, MD  [facility] Direct Dial: [phone]  Fax: [phone] [email or web address]  Skype  [email or web address]  This document serves as a record of services personally performed by Tyler Pita, MD and Freeman Caldron, PA-C. It was  created on their behalf by Wilburn Mylar, a trained medical scribe. The creation of this record is based on the scribe's personal observations and the provider's statements to them. This document has been checked and approved by the attending provider.

## 2019-11-25 ENCOUNTER — Other Ambulatory Visit: Payer: Self-pay | Admitting: Urology

## 2019-11-26 ENCOUNTER — Telehealth: Payer: Self-pay | Admitting: *Deleted

## 2019-11-26 NOTE — Telephone Encounter (Signed)
Called patient to inform of pre-seed appts. and implant, spoke with patient and he is aware of these appts 

## 2019-11-26 NOTE — Telephone Encounter (Signed)
Called patient to inform of pre-seed appts. and implant, no answer unable to leave message no answering machine

## 2019-11-29 ENCOUNTER — Telehealth: Payer: Self-pay | Admitting: Medical Oncology

## 2019-11-29 NOTE — Telephone Encounter (Signed)
Spoke with patient to introduce myself as the prostate nurse navigator and discuss my role. I was unable to meet him 2/2, when he consulted with Dr. Tammi Klippel. He states the visit went well and has chosen brachytherapy as treatment. He has spoke with Enid Derry and is scheduled for CT simulation 2/25 @ 9 am. He confirmed this appointment. I gave him my contact information and asked him to call me with questions or concerns. He voiced understanding.

## 2019-12-02 ENCOUNTER — Other Ambulatory Visit: Payer: Self-pay | Admitting: Urology

## 2019-12-02 DIAGNOSIS — C61 Malignant neoplasm of prostate: Secondary | ICD-10-CM

## 2019-12-08 DIAGNOSIS — I10 Essential (primary) hypertension: Secondary | ICD-10-CM | POA: Diagnosis not present

## 2019-12-08 DIAGNOSIS — L570 Actinic keratosis: Secondary | ICD-10-CM | POA: Diagnosis not present

## 2019-12-08 DIAGNOSIS — C61 Malignant neoplasm of prostate: Secondary | ICD-10-CM | POA: Diagnosis not present

## 2019-12-08 DIAGNOSIS — E782 Mixed hyperlipidemia: Secondary | ICD-10-CM | POA: Diagnosis not present

## 2019-12-11 ENCOUNTER — Encounter: Payer: Self-pay | Admitting: Urology

## 2019-12-13 ENCOUNTER — Other Ambulatory Visit (HOSPITAL_COMMUNITY)
Admission: RE | Admit: 2019-12-13 | Discharge: 2019-12-13 | Disposition: A | Discharge status: Home / Self Care | Payer: BC Managed Care – PPO | Source: Ambulatory Visit | Attending: Radiation Oncology | Admitting: Radiation Oncology

## 2019-12-13 DIAGNOSIS — Z20822 Contact with and (suspected) exposure to covid-19: Secondary | ICD-10-CM | POA: Insufficient documentation

## 2019-12-13 DIAGNOSIS — Z01812 Encounter for preprocedural laboratory examination: Secondary | ICD-10-CM | POA: Insufficient documentation

## 2019-12-13 LAB — SARS CORONAVIRUS 2 (TAT 6-24 HRS): SARS Coronavirus 2: NEGATIVE

## 2019-12-15 ENCOUNTER — Telehealth: Payer: Self-pay | Admitting: *Deleted

## 2019-12-15 NOTE — Telephone Encounter (Signed)
CALLED PATIENT TO REMIND OF PRE-SEED APPTS. AND MRI FOR 12-16-19, SPOKE WITH PATIENT AND HE IS AWARE OF THESE APPTS.

## 2019-12-16 ENCOUNTER — Ambulatory Visit (HOSPITAL_COMMUNITY)
Admission: RE | Admit: 2019-12-16 | Discharge: 2019-12-16 | Disposition: A | Discharge status: Home / Self Care | Payer: BC Managed Care – PPO | Source: Ambulatory Visit | Attending: Urology | Admitting: Urology

## 2019-12-16 ENCOUNTER — Encounter: Payer: Self-pay | Admitting: Medical Oncology

## 2019-12-16 ENCOUNTER — Ambulatory Visit
Admission: RE | Admit: 2019-12-16 | Discharge: 2019-12-16 | Disposition: A | Discharge status: Home / Self Care | Payer: BC Managed Care – PPO | Source: Ambulatory Visit | Attending: Urology | Admitting: Urology

## 2019-12-16 ENCOUNTER — Other Ambulatory Visit: Payer: Self-pay

## 2019-12-16 ENCOUNTER — Encounter (HOSPITAL_COMMUNITY)
Admission: RE | Admit: 2019-12-16 | Discharge: 2019-12-16 | Disposition: A | Discharge status: Home / Self Care | Payer: BC Managed Care – PPO | Source: Ambulatory Visit | Attending: Orthopedic Surgery | Admitting: Orthopedic Surgery

## 2019-12-16 ENCOUNTER — Ambulatory Visit
Admission: RE | Admit: 2019-12-16 | Discharge: 2019-12-16 | Disposition: A | Discharge status: Home / Self Care | Payer: BC Managed Care – PPO | Source: Ambulatory Visit | Attending: Radiation Oncology | Admitting: Radiation Oncology

## 2019-12-16 DIAGNOSIS — C61 Malignant neoplasm of prostate: Secondary | ICD-10-CM | POA: Diagnosis not present

## 2019-12-16 DIAGNOSIS — C760 Malignant neoplasm of head, face and neck: Secondary | ICD-10-CM | POA: Diagnosis not present

## 2019-12-16 NOTE — Progress Notes (Signed)
  Radiation Oncology         [phone] ________________________________  Name: Stephen Ayers MRN: [id number]  Date: 12/16/2019  DOB: Nov 15, 1960  SIMULATION AND TREATMENT PLANNING NOTE PUBIC ARCH STUDY  [id number], Gwyndolyn Saxon, MD  Shirline Frees, MD  DIAGNOSIS: 59 y.o. gentleman with Stage T1c adenocarcinoma of the prostate with Gleason score of 3+4, and PSA of 5.98.     ICD-10-CM   1. Malignant neoplasm of prostate ([place])  C61     COMPLEX SIMULATION:  The patient presented today for evaluation for possible prostate seed implant. He was brought to the radiation planning suite and placed supine on the CT couch. A 3-dimensional image study set was obtained in upload to the planning computer. There, on each axial slice, I contoured the prostate gland. Then, using three-dimensional radiation planning tools I reconstructed the prostate in view of the structures from the transperineal needle pathway to assess for possible pubic arch interference. In doing so, I did not appreciate any pubic arch interference. Also, the patient's prostate volume was estimated based on the drawn structure. The volume was 33 cc.  Given the pubic arch appearance and prostate volume, patient remains a good candidate to proceed with prostate seed implant. Today, he freely provided informed written consent to proceed.    PLAN: The patient will undergo prostate seed implant.   ________________________________  Sheral Apley. Tammi Klippel, M.D.

## 2019-12-28 DIAGNOSIS — C61 Malignant neoplasm of prostate: Secondary | ICD-10-CM | POA: Diagnosis not present

## 2020-01-03 ENCOUNTER — Encounter: Payer: Self-pay | Admitting: Medical Oncology

## 2020-01-05 ENCOUNTER — Telehealth: Payer: Self-pay | Admitting: *Deleted

## 2020-01-05 ENCOUNTER — Other Ambulatory Visit: Payer: Self-pay

## 2020-01-05 ENCOUNTER — Encounter (HOSPITAL_BASED_OUTPATIENT_CLINIC_OR_DEPARTMENT_OTHER): Payer: Self-pay | Admitting: Urology

## 2020-01-05 NOTE — Telephone Encounter (Signed)
CALLED PATIENT TO REMIND OF LAB AND COVID TESTING FOR 01-10-20, SPOKE WITH PATIENT AND HE IS AWARE OF THESE APPTS.

## 2020-01-05 NOTE — Progress Notes (Signed)
Spoke w/ via phone for pre-op interview---Stephen Ayers needs dos----none              Ayers results------ekg and chest xray both done on 12-16-2019 epic, has Ayers appt for 01-10-2020 at 1000 am for cbc, cmet, pt, ptt COVID test ------01-10-2020 at 1110 am Arrive at -------730 am 01-13-2020 NPO after ------midnight Medications to take morning of surgery -----atorvastatin, tamsulosin Diabetic medication -----n/a Patient Special Instructions -----stop naprosyn per dr Jeffie Pollock instructions Pre-Op special Istructions -----fleets enema am of surgery Patient verbalized understanding of instructions that were given at this phone interview. Patient denies shortness of breath, chest pain, fever, cough a this phone interview.

## 2020-01-10 ENCOUNTER — Other Ambulatory Visit (HOSPITAL_COMMUNITY)
Admission: RE | Admit: 2020-01-10 | Discharge: 2020-01-10 | Disposition: A | Discharge status: Home / Self Care | Payer: BC Managed Care – PPO | Source: Ambulatory Visit | Attending: Urology | Admitting: Urology

## 2020-01-10 ENCOUNTER — Other Ambulatory Visit: Payer: Self-pay

## 2020-01-10 ENCOUNTER — Encounter (HOSPITAL_COMMUNITY)
Admission: RE | Admit: 2020-01-10 | Discharge: 2020-01-10 | Disposition: A | Discharge status: Home / Self Care | Payer: BC Managed Care – PPO | Source: Ambulatory Visit | Attending: Urology | Admitting: Urology

## 2020-01-10 DIAGNOSIS — Z20822 Contact with and (suspected) exposure to covid-19: Secondary | ICD-10-CM | POA: Insufficient documentation

## 2020-01-10 DIAGNOSIS — Z01812 Encounter for preprocedural laboratory examination: Secondary | ICD-10-CM | POA: Insufficient documentation

## 2020-01-10 LAB — COMPREHENSIVE METABOLIC PANEL
ALT: 32 U/L (ref 0–44)
AST: 29 U/L (ref 15–41)
Albumin: 4.4 g/dL (ref 3.5–5.0)
Alkaline Phosphatase: 70 U/L (ref 38–126)
Anion gap: 8 (ref 5–15)
BUN: 21 mg/dL (ref 8–23)
CO2: 25 mmol/L (ref 22–32)
Calcium: 9 mg/dL (ref 8.9–10.3)
Chloride: 106 mmol/L (ref 98–111)
Creatinine, Ser: 1.18 mg/dL (ref 0.61–1.24)
GFR calc Af Amer: >60 mL/min (ref >60)
GFR calc non Af Amer: >60 mL/min (ref >60)
Glucose, Bld: 115 mg/dL — ABNORMAL HIGH (ref 70–99)
Potassium: 3.7 mmol/L (ref 3.5–5.1)
Sodium: 139 mmol/L (ref 135–145)
Total Bilirubin: 1.1 mg/dL (ref 0.3–1.2)
Total Protein: 7 g/dL (ref 6.5–8.1)

## 2020-01-10 LAB — PROTIME-INR
INR: 1 (ref 0.8–1.2)
Prothrombin Time: 13.5 seconds (ref 11.4–15.2)

## 2020-01-10 LAB — CBC
HCT: 40.3 % (ref 39.0–52.0)
Hemoglobin: 13.8 g/dL (ref 13.0–17.0)
MCH: 31.2 pg (ref 26.0–34.0)
MCHC: 34.2 g/dL (ref 30.0–36.0)
MCV: 91.2 fL (ref 80.0–100.0)
Platelets: 259 10*3/uL (ref 150–400)
RBC: 4.42 MIL/uL (ref 4.22–5.81)
RDW: 12.4 % (ref 11.5–15.5)
WBC: 4.7 10*3/uL (ref 4.0–10.5)
nRBC: 0 % (ref 0.0–0.2)

## 2020-01-10 LAB — APTT: aPTT: 29 seconds (ref 24–36)

## 2020-01-11 LAB — SARS CORONAVIRUS 2 (TAT 6-24 HRS): SARS Coronavirus 2: NEGATIVE

## 2020-01-12 ENCOUNTER — Telehealth: Payer: Self-pay | Admitting: *Deleted

## 2020-01-12 NOTE — H&P (Signed)
Pt presents today for pre-operative history and physical exam in anticipation of radioactive seed implant and space oar placement by Dr. Jeffie Pollock on 01/13/20. He is doing well and is without complaint. Pt denies F/C, HA, CP, SOB, N/V, diarrhea/constipation, back pain, flank pain, hematuria, and dysuria.    HX:     CC: I have prostate cancer.  HPI: Stephen Ayers is a 59 year-old male established patient who is here evaluation for treatment of prostate cancer.  Stephen Ayers returns today to discuss his prostate biopsy results. The biopsy was done for a PSA of 5.98 with an 11% f/t ratio.   T1c Nx Mx Gleason 7(3+4) favorable intermediate risk prostate cancer.   Prostate volume 3ml   PSAD: 0.18   GG1 in 10% left base lateral core, 5% left mid lateral core, 10% left mid medial core, 5% left apex lateral core, 5% right apex lateral core.   GG2 in 10% right apex medial core.   Atypia in right base lateral core.   MSKCC predicts. 59% OCD, 39% ECE, 4% LNI, 4% SVI with PFS of 83% at 5 years and 73% at 10 years.   Prostate Predict: 6% mortality at 10 years and 11% at 77yrs with conservative management with 50-55% relative risk reduction with therapy.   IPSS: 14.   SHIM: 18.       His prostate cancer was diagnosed 10/06/2020. He does have the pathology report from his biopsy. His PSA at his time of diagnosis was 5.98.     ALLERGIES: No Allergies    MEDICATIONS: Aleve  Atorvastatin Calcium 80 mg tablet Oral  Flomax 0.4 mg capsule  Losartan Potassium 100 mg tablet Oral  Multi Vitamin  Tylenol Sinus Headache     GU PSH: Prostate Needle Biopsy - 10/07/2019       PSH Notes: Oral Surgery Tooth Extraction   NON-GU PSH: Dental Surgery Procedure - 2017 Surgical Pathology, Gross And Microscopic Examination For Prostate Needle - 10/07/2019     GU PMH: Prostate Cancer, T1c Nx Mx Gleason 7(3+4) favorable intermediate risk prostate cancer. His prostate is 7ml. He has mild ED and  Moderate LUTS. I discussed AS, RALP, Brachytherapy, EXRT, cryo and hifu and he is most interested in Brachytherapy. I will get him set up to see Dr. Tammi Klippel and if he concurs, we will get him scheduled. - 11/04/2019 Elevated PSA - 10/07/2019, (Worsening), His PSA is up significantly but his exam is benign. I will repeat the PSA today and if it remains above 4 I will do a biopsy but if it is below 4 I will have him return in 3 months with a PSA before making the decision about a biopsy. , - 08/18/2019 (Improving), His PSA has declined to normal, - 2018 (Worsening), His PSA is minimally elevated but reasonable stable over the past year with a benign exam., - 2017 Urinary Frequency - 08/18/2019 Urinary Hesitancy - 2018 Acute prostatitis (Improving), His voiding symptoms have resolved with the [place] and haven't recurred with cessation. - 2017 BPH w/LUTS (Improving) - 2017, Benign prostatic hypertrophy (BPH) with incomplete bladder emptying, - 2017 Disorder of Penis Ot, Penile pain - 2017 Weak Urinary Stream, Weak urinary stream - 2017    NON-GU PMH: Atrial premature depolarization, PAC (premature atrial contraction) - 2017 Benign paroxysmal vertigo, unspecified ear, BPPV (benign paroxysmal positional vertigo) - 2017 Encounter for general adult medical examination without abnormal findings, Encounter for preventive health examination - 2017 Personal history of other diseases of the circulatory system,  History of hypertension - 2017 Personal history of other diseases of the musculoskeletal system and connective tissue, History of arthritis - 2017 Personal history of other endocrine, nutritional and metabolic disease, History of hypercholesterolemia - 2017 Personal history of traumatic brain injury, History of concussion - 2017 Unspecified fracture of skull, initial encounter for closed fracture, Skull fracture with concussion - 2017    FAMILY HISTORY: Mitral valve prolapse - Runs In Family renal  failure - Runs In Family   SOCIAL HISTORY: Marital Status: Married Preferred Language: English; Race: White Current Smoking Status: Patient has never smoked.  Does not use smokeless tobacco. Does drink.  Does not use drugs. Drinks 2 caffeinated drinks per day. Has not had a blood transfusion.     Notes: Occupation, Married, Alcohol use, Number of children, Never a smoker, Caffeine use  glass of wine per night    REVIEW OF SYSTEMS:    GU Review Male:   Patient denies frequent urination, hard to postpone urination, burning/ pain with urination, get up at night to urinate, leakage of urine, stream starts and stops, trouble starting your stream, have to strain to urinate , erection problems, and penile pain.  Gastrointestinal (Upper):   Patient denies nausea, vomiting, and indigestion/ heartburn.  Gastrointestinal (Lower):   Patient denies diarrhea and constipation.  Constitutional:   Patient denies fever, night sweats, weight loss, and fatigue.  Skin:   Patient denies itching and skin rash/ lesion.  Eyes:   Patient denies blurred vision and double vision.  Ears/ Nose/ Throat:   Patient denies sore throat and sinus problems.  Hematologic/Lymphatic:   Patient denies swollen glands and easy bruising.  Cardiovascular:   Patient denies leg swelling and chest pains.  Respiratory:   Patient denies cough and shortness of breath.  Endocrine:   Patient denies excessive thirst.  Musculoskeletal:   Patient reports joint pain. Patient denies back pain.  Neurological:   Patient denies headaches and dizziness.  Psychologic:   Patient denies depression and anxiety.   VITAL SIGNS:      12/28/2019 02:26 PM  Weight 175 lb / 79.38 kg  Height 67 in / 170.18 cm  BP 132/65 mmHg  Pulse 76 /min  Temperature 97.8 F / 36.5 C  BMI 27.4 kg/m   MULTI-SYSTEM PHYSICAL EXAMINATION:    Constitutional: Well-nourished. No physical deformities. Normally developed. Good grooming.  Neck: Neck symmetrical, not  swollen. Normal tracheal position.  Respiratory: Normal breath sounds. No labored breathing, no use of accessory muscles.   Cardiovascular: Regular rate and rhythm. No murmur, no gallop.   Lymphatic: No enlargement of neck, axillae, groin.  Skin: No paleness, no jaundice, no cyanosis. No lesion, no ulcer, no rash.  Neurologic / Psychiatric: Oriented to time, oriented to place, oriented to person. No depression, no anxiety, no agitation.  Gastrointestinal: No mass, no tenderness, no rigidity, non obese abdomen.  Eyes: Normal conjunctivae. Normal eyelids.  Ears, Nose, Mouth, and Throat: Left ear no scars, no lesions, no masses. Right ear no scars, no lesions, no masses. Nose no scars, no lesions, no masses. Normal hearing. Normal lips.  Musculoskeletal: Normal gait and station of head and neck.     PAST DATA REVIEWED:  Source Of History:  Patient  Records Review:   Previous Patient Records  Urine Test Review:   Urinalysis   12/28/19  Urinalysis  Urine Appearance Clear   Urine Color Yellow   Urine Glucose Neg mg/dL  Urine Bilirubin Neg mg/dL  Urine Ketones Neg mg/dL  Urine Specific Gravity 1.020   Urine Blood Neg ery/uL  Urine pH 6.0   Urine Protein Neg mg/dL  Urine Urobilinogen 0.2 mg/dL  Urine Nitrites Neg   Urine Leukocyte Esterase Neg leu/uL   PROCEDURES:          Urinalysis - 81003 Dipstick Dipstick Cont'd  Specimen: Patient Cath Bilirubin: Neg mg/dL  Color: Yellow Ketones: Neg mg/dL  Appearance: Clear Blood: Neg ery/uL  Specific Gravity: 1.020 Protein: Neg mg/dL  pH: 6.0 Urobilinogen: 0.2 mg/dL  Glucose: Neg mg/dL Nitrites: Neg    Leukocyte Esterase: Neg leu/uL    ASSESSMENT:      ICD-10 Details  1 GU:   Prostate Cancer - C61    PLAN:           Schedule Return Visit/Planned Activity: Keep Scheduled Appointment - Schedule Surgery          Document Letter(s):  Created for Patient: Clinical Summary         Notes:   There are no changes in the patients history  or physical exam since last evaluation by Dr. Jeffie Pollock. Pt is scheduled to undergo seed implant and space oar on 01/13/20.   All pt's questions were answered to the best of my ability.          Next Appointment:      Next Appointment: 01/13/2020 09:30 AM    Appointment Type: Surgery     Location: [facility] [phone]    Provider: Irine Seal, M.D.    Reason for Visit: [place]

## 2020-01-12 NOTE — Telephone Encounter (Signed)
CALLED PATIENT TO REMIND OF IMPLANT FOR 01-13-20, LVM FOR A RETURN CALL

## 2020-01-13 ENCOUNTER — Encounter (HOSPITAL_BASED_OUTPATIENT_CLINIC_OR_DEPARTMENT_OTHER)
Admission: RE | Disposition: A | Discharge status: Home / Self Care | Payer: Self-pay | Source: Other Acute Inpatient Hospital | Attending: Urology

## 2020-01-13 ENCOUNTER — Ambulatory Visit (HOSPITAL_BASED_OUTPATIENT_CLINIC_OR_DEPARTMENT_OTHER): Payer: BC Managed Care – PPO | Admitting: Anesthesiology

## 2020-01-13 ENCOUNTER — Ambulatory Visit (HOSPITAL_BASED_OUTPATIENT_CLINIC_OR_DEPARTMENT_OTHER)
Admission: RE | Admit: 2020-01-13 | Discharge: 2020-01-13 | Disposition: A | Discharge status: Home / Self Care | Payer: BC Managed Care – PPO | Source: Other Acute Inpatient Hospital | Attending: Urology | Admitting: Urology

## 2020-01-13 ENCOUNTER — Ambulatory Visit (HOSPITAL_COMMUNITY): Payer: BC Managed Care – PPO

## 2020-01-13 ENCOUNTER — Encounter (HOSPITAL_BASED_OUTPATIENT_CLINIC_OR_DEPARTMENT_OTHER): Payer: Self-pay | Admitting: Urology

## 2020-01-13 ENCOUNTER — Other Ambulatory Visit: Payer: Self-pay

## 2020-01-13 DIAGNOSIS — C61 Malignant neoplasm of prostate: Secondary | ICD-10-CM | POA: Insufficient documentation

## 2020-01-13 DIAGNOSIS — Z79899 Other long term (current) drug therapy: Secondary | ICD-10-CM | POA: Insufficient documentation

## 2020-01-13 DIAGNOSIS — K219 Gastro-esophageal reflux disease without esophagitis: Secondary | ICD-10-CM | POA: Diagnosis not present

## 2020-01-13 DIAGNOSIS — I10 Essential (primary) hypertension: Secondary | ICD-10-CM | POA: Insufficient documentation

## 2020-01-13 HISTORY — DX: Gastro-esophageal reflux disease without esophagitis: K21.9

## 2020-01-13 HISTORY — PX: RADIOACTIVE SEED IMPLANT: SHX5150

## 2020-01-13 HISTORY — PX: SPACE OAR INSTILLATION: SHX6769

## 2020-01-13 HISTORY — DX: Headache, unspecified: R51.9

## 2020-01-13 HISTORY — DX: Unspecified osteoarthritis, unspecified site: M19.90

## 2020-01-13 SURGERY — INSERTION, RADIATION SOURCE, PROSTATE
Anesthesia: General | Site: Prostate

## 2020-01-13 MED ORDER — ACETAMINOPHEN 650 MG RE SUPP
650.0000 mg | RECTAL | Status: DC | PRN
  Filled 2020-01-13: qty 1

## 2020-01-13 MED ORDER — ONDANSETRON HCL 4 MG/2ML IJ SOLN
INTRAMUSCULAR | Status: AC
  Filled 2020-01-13: qty 2

## 2020-01-13 MED ORDER — CELECOXIB 200 MG PO CAPS
200.0000 mg | ORAL_CAPSULE | Freq: Once | ORAL | Status: AC
  Administered 2020-01-13: 200 mg via ORAL
  Filled 2020-01-13: qty 1

## 2020-01-13 MED ORDER — FENTANYL CITRATE (PF) 100 MCG/2ML IJ SOLN
INTRAMUSCULAR | Status: AC
  Filled 2020-01-13: qty 2

## 2020-01-13 MED ORDER — LIDOCAINE 2% (20 MG/ML) 5 ML SYRINGE
INTRAMUSCULAR | Status: DC | PRN
  Administered 2020-01-13: 100 mg via INTRAVENOUS

## 2020-01-13 MED ORDER — ACETAMINOPHEN 500 MG PO TABS
1000.0000 mg | ORAL_TABLET | Freq: Once | ORAL | Status: AC
  Administered 2020-01-13: 1000 mg via ORAL
  Filled 2020-01-13: qty 2

## 2020-01-13 MED ORDER — PROPOFOL 10 MG/ML IV BOLUS
INTRAVENOUS | Status: AC
  Filled 2020-01-13: qty 20

## 2020-01-13 MED ORDER — SODIUM CHLORIDE 0.9% FLUSH
3.0000 mL | Freq: Two times a day (BID) | INTRAVENOUS | Status: DC
  Filled 2020-01-13: qty 3

## 2020-01-13 MED ORDER — ACETAMINOPHEN 325 MG PO TABS
650.0000 mg | ORAL_TABLET | ORAL | Status: DC | PRN
  Filled 2020-01-13: qty 2

## 2020-01-13 MED ORDER — HYDROCODONE-ACETAMINOPHEN 5-325 MG PO TABS: 1.0000 | ORAL_TABLET | Freq: Four times a day (QID) | ORAL | 0 refills | Status: DC | PRN

## 2020-01-13 MED ORDER — CIPROFLOXACIN IN D5W 400 MG/200ML IV SOLN
400.0000 mg | INTRAVENOUS | Status: AC
  Administered 2020-01-13: 400 mg via INTRAVENOUS
  Filled 2020-01-13: qty 200

## 2020-01-13 MED ORDER — ONDANSETRON HCL 4 MG/2ML IJ SOLN
INTRAMUSCULAR | Status: DC | PRN
  Administered 2020-01-13: 4 mg via INTRAVENOUS

## 2020-01-13 MED ORDER — SODIUM CHLORIDE 0.9% FLUSH
3.0000 mL | INTRAVENOUS | Status: DC | PRN
  Filled 2020-01-13: qty 3

## 2020-01-13 MED ORDER — FENTANYL CITRATE (PF) 100 MCG/2ML IJ SOLN
25.0000 ug | INTRAMUSCULAR | Status: DC | PRN
  Administered 2020-01-13: 25 ug via INTRAVENOUS
  Filled 2020-01-13: qty 1

## 2020-01-13 MED ORDER — PROPOFOL 500 MG/50ML IV EMUL
INTRAVENOUS | Status: DC | PRN
  Administered 2020-01-13: 200 mg via INTRAVENOUS

## 2020-01-13 MED ORDER — SODIUM CHLORIDE 0.9 % IR SOLN
Status: DC | PRN
  Administered 2020-01-13: 1000 mL via INTRAVESICAL

## 2020-01-13 MED ORDER — DEXAMETHASONE SODIUM PHOSPHATE 10 MG/ML IJ SOLN
INTRAMUSCULAR | Status: DC | PRN
  Administered 2020-01-13: 10 mg via INTRAVENOUS

## 2020-01-13 MED ORDER — ACETAMINOPHEN 500 MG PO TABS
ORAL_TABLET | ORAL | Status: AC
  Filled 2020-01-13: qty 2

## 2020-01-13 MED ORDER — LIDOCAINE 2% (20 MG/ML) 5 ML SYRINGE
INTRAMUSCULAR | Status: AC
  Filled 2020-01-13: qty 5

## 2020-01-13 MED ORDER — FLEET ENEMA 7-19 GM/118ML RE ENEM
1.0000 | ENEMA | Freq: Once | RECTAL | Status: DC
  Filled 2020-01-13: qty 1

## 2020-01-13 MED ORDER — OXYCODONE HCL 5 MG PO TABS
5.0000 mg | ORAL_TABLET | ORAL | Status: DC | PRN
  Filled 2020-01-13: qty 2

## 2020-01-13 MED ORDER — DEXAMETHASONE SODIUM PHOSPHATE 10 MG/ML IJ SOLN
INTRAMUSCULAR | Status: AC
  Filled 2020-01-13: qty 1

## 2020-01-13 MED ORDER — SODIUM CHLORIDE 0.9 % IV SOLN
250.0000 mL | INTRAVENOUS | Status: DC | PRN
  Filled 2020-01-13: qty 250

## 2020-01-13 MED ORDER — SODIUM CHLORIDE (PF) 0.9 % IJ SOLN
INTRAMUSCULAR | Status: DC | PRN
  Administered 2020-01-13: 3 mL via INTRAVENOUS

## 2020-01-13 MED ORDER — CIPROFLOXACIN IN D5W 400 MG/200ML IV SOLN
INTRAVENOUS | Status: AC
  Filled 2020-01-13: qty 200

## 2020-01-13 MED ORDER — MIDAZOLAM HCL 2 MG/2ML IJ SOLN
INTRAMUSCULAR | Status: AC
  Filled 2020-01-13: qty 2

## 2020-01-13 MED ORDER — IOHEXOL 300 MG/ML  SOLN
INTRAMUSCULAR | Status: DC | PRN
  Administered 2020-01-13: 7 mL

## 2020-01-13 MED ORDER — CELECOXIB 200 MG PO CAPS
ORAL_CAPSULE | ORAL | Status: AC
  Filled 2020-01-13: qty 1

## 2020-01-13 MED ORDER — MIDAZOLAM HCL 2 MG/2ML IJ SOLN
INTRAMUSCULAR | Status: DC | PRN
  Administered 2020-01-13: 2 mg via INTRAVENOUS

## 2020-01-13 MED ORDER — FENTANYL CITRATE (PF) 100 MCG/2ML IJ SOLN
INTRAMUSCULAR | Status: DC | PRN
  Administered 2020-01-13 (×2): 50 ug via INTRAVENOUS

## 2020-01-13 MED ORDER — LACTATED RINGERS IV SOLN
INTRAVENOUS | Status: DC
  Filled 2020-01-13: qty 1000

## 2020-01-13 MED ORDER — PROMETHAZINE HCL 25 MG/ML IJ SOLN
6.2500 mg | INTRAMUSCULAR | Status: DC | PRN
  Filled 2020-01-13: qty 1

## 2020-01-13 MED ORDER — MORPHINE SULFATE (PF) 2 MG/ML IV SOLN
2.0000 mg | INTRAVENOUS | Status: DC | PRN
  Filled 2020-01-13: qty 1

## 2020-01-13 SURGICAL SUPPLY — 37 items
BAG URINE DRAIN 2000ML AR STRL (UROLOGICAL SUPPLIES) ×3 IMPLANT
BLADE CLIPPER SENSICLIP SURGIC (BLADE) ×3 IMPLANT
CATH FOLEY 2WAY SLVR  5CC 16FR (CATHETERS) ×3
CATH FOLEY 2WAY SLVR 5CC 16FR (CATHETERS) ×1 IMPLANT
CATH ROBINSON RED A/P 16FR (CATHETERS) IMPLANT
CATH ROBINSON RED A/P 20FR (CATHETERS) ×3 IMPLANT
CLOTH BEACON ORANGE TIMEOUT ST (SAFETY) ×3 IMPLANT
CNTNR URN SCR LID CUP LEK RST (MISCELLANEOUS) ×2 IMPLANT
CONT SPEC 4OZ STRL OR WHT (MISCELLANEOUS) ×6
COVER BACK TABLE 60X90IN (DRAPES) ×3 IMPLANT
COVER MAYO STAND STRL (DRAPES) ×3 IMPLANT
DRSG TEGADERM 4X4.75 (GAUZE/BANDAGES/DRESSINGS) ×4 IMPLANT
DRSG TEGADERM 8X12 (GAUZE/BANDAGES/DRESSINGS) ×6 IMPLANT
GAUZE SPONGE 4X4 12PLY STRL (GAUZE/BANDAGES/DRESSINGS) ×2 IMPLANT
GLOVE BIO SURGEON STRL SZ7.5 (GLOVE) IMPLANT
GLOVE BIO SURGEON STRL SZ8 (GLOVE) IMPLANT
GLOVE SURG ORTHO 8.5 STRL (GLOVE) ×3 IMPLANT
GLOVE SURG SS PI 6.5 STRL IVOR (GLOVE) IMPLANT
GLOVE SURG SS PI 8.0 STRL IVOR (GLOVE) ×6 IMPLANT
GOWN STRL REUS W/ TWL XL LVL3 (GOWN DISPOSABLE) ×1 IMPLANT
GOWN STRL REUS W/TWL XL LVL3 (GOWN DISPOSABLE) ×6 IMPLANT
HOLDER FOLEY CATH W/STRAP (MISCELLANEOUS) ×3 IMPLANT
I-SEED AGX100 ×128 IMPLANT
IMPL SPACEOAR SYSTEM 10ML (Spacer) ×1 IMPLANT
IMPLANT SPACEOAR SYSTEM 10ML (Spacer) ×3 IMPLANT
IV NS 1000ML (IV SOLUTION) ×3
IV NS 1000ML BAXH (IV SOLUTION) ×1 IMPLANT
KIT TURNOVER CYSTO (KITS) ×3 IMPLANT
MARKER SKIN DUAL TIP RULER LAB (MISCELLANEOUS) ×3 IMPLANT
PACK CYSTO (CUSTOM PROCEDURE TRAY) ×3 IMPLANT
SURGILUBE 2OZ TUBE FLIPTOP (MISCELLANEOUS) IMPLANT
SUT BONE WAX W31G (SUTURE) IMPLANT
SYR 10ML LL (SYRINGE) ×2 IMPLANT
TOWEL OR 17X26 10 PK STRL BLUE (TOWEL DISPOSABLE) ×3 IMPLANT
UNDERPAD 30X30 (UNDERPADS AND DIAPERS) ×6 IMPLANT
WATER STERILE IRR 3000ML UROMA (IV SOLUTION) ×1 IMPLANT
WATER STERILE IRR 500ML POUR (IV SOLUTION) ×3 IMPLANT

## 2020-01-13 NOTE — Op Note (Signed)
PATIENT:  Stephen Ayers  PRE-OPERATIVE DIAGNOSIS:  Adenocarcinoma of the prostate  POST-OPERATIVE DIAGNOSIS:  Same  PROCEDURE:  Procedure(s): 1. I-125 radioactive seed implantation 2. SpaceOAR implantation. 3.  Cystoscopy  SURGEON:  Surgeon(s): Irine Seal MD  Radiation oncologist: Dr. Tyler Pita  ANESTHESIA:  General  EBL:  Minimal  DRAINS: 35 French Foley catheter  INDICATION: Stephen Ayers is a 59 y.o. with Stage T1c, Gleason 7(3+4) prostate cancer who has elected brachytherapy for treatment.  Description of procedure: After informed consent the patient was brought to the major OR, placed on the table and administered general anesthesia. He was then moved to the modified lithotomy position with his perineum perpendicular to the floor. His perineum and genitalia were then sterilely prepped. An official timeout was then performed. A 16 French Foley catheter was then placed in the bladder and filled with dilute contrast, a rectal tube was placed in the rectum and the transrectal ultrasound probe was placed in the rectum and affixed to the stand. He was then sterilely draped.  The sterile grid was installed.   Anchor needles were then placed.   Real time ultrasonography was used along with the seed planning software spot-pro version 3.1-00. This was used to develop the seed plan including the number of needles as well as number of seeds required for complete and adequate coverage. Real-time ultrasonography was then used along with the previously developed plan  to implant a total of 64 seeds using 19 needles for a target dose of 145 Gy. This proceeded without difficulty or complication.  The anchor needles and guide were removed and the SpaceOAR needle was passed under US guidance into the fat stripe posterior to the prostate with the tip in the midline at mid prostate. A puff of NS confirmed appropriate positioning and the SpaceOAR polymer was then injected over 10 seconds into the  space with excellent distribution.     A Foley catheter was then removed as well as the transrectal ultrasound probe and rectal probe. Flexible cystoscopy was then performed using the 17 French flexible scope which revealed a normal urethra throughout its length down to the sphincter which appeared intact. The prostatic urethra was 3cm with bilobar hyperplasia with mild coaptation. The bladder was then entered and fully and systematically inspected.  The ureteral orifices were noted to be of normal configuration and position. The mucosa revealed no evidence of tumors. There were also no stones identified within the bladder.  no seeds or spacers were seen and/or removed from the bladder.  The cystoscope was then removed.  The drapes were removed.  The perineum was cleaned and dressed.  He was taken out of the lithotomy position and was awakened and taken to recovery room in stable and satisfactory condition. He tolerated procedure well and there were no intraoperative complications.

## 2020-01-13 NOTE — Anesthesia Procedure Notes (Signed)
Procedure Name: LMA Insertion Date/Time: 01/13/2020 9:59 AM Performed by: Gerald Leitz, CRNA Pre-anesthesia Checklist: Patient identified, Patient being monitored, Timeout performed, Emergency Drugs available and Suction available Patient Re-evaluated:Patient Re-evaluated prior to induction Oxygen Delivery Method: Circle system utilized Preoxygenation: Pre-oxygenation with 100% oxygen Induction Type: IV induction Ventilation: Mask ventilation without difficulty LMA: LMA inserted and LMA with gastric port inserted LMA Size: 4.0 Tube type: Oral Number of attempts: 1 Placement Confirmation: positive ETCO2 and breath sounds checked- equal and bilateral Tube secured with: Tape Dental Injury: Teeth and Oropharynx as per pre-operative assessment

## 2020-01-13 NOTE — Transfer of Care (Signed)
Immediate Anesthesia Transfer of Care Note  Patient: Stephen Ayers  Procedure(s) Performed: Procedure(s): RADIOACTIVE SEED IMPLANT/BRACHYTHERAPY IMPLANT (N/A) SPACE OAR INSTILLATION (N/A)  Patient Location: PACU  Anesthesia Type:General  Level of Consciousness: Alert, Awake, Oriented  Airway & Oxygen Therapy: Patient Spontanous Breathing  Post-op Assessment: Report given to RN  Post vital signs: Reviewed and stable  Last Vitals:  Vitals:   01/13/20 0807  BP: (!) 143/76  Pulse: 90  Resp: 14  Temp: 37 C  SpO2: [id number]    Complications: No apparent anesthesia complications

## 2020-01-13 NOTE — Interval H&P Note (Signed)
History and Physical Interval Note:  01/13/2020 9:17 AM  Stephen Ayers  has presented today for surgery, with the diagnosis of PROSTATE CANCER.  The various methods of treatment have been discussed with the patient and family. After consideration of risks, benefits and other options for treatment, the patient has consented to  Procedure(s): RADIOACTIVE SEED IMPLANT/BRACHYTHERAPY IMPLANT (N/A) SPACE OAR INSTILLATION (N/A) as a surgical intervention.  The patient's history has been reviewed, patient examined, no change in status, stable for surgery.  I have reviewed the patient's chart and labs.  Questions were answered to the patient's satisfaction.     Irine Seal

## 2020-01-13 NOTE — Discharge Instructions (Addendum)
Brachytherapy for Prostate Cancer, Care After ° °This sheet gives you information about how to care for yourself after your procedure. Your health care provider may also give you more specific instructions. If you have problems or questions, contact your health care provider. °What can I expect after the procedure? °After the procedure, it is common to have: °· Trouble passing urine. °· Blood in the urine or semen. °· Constipation. °· Frequent feeling of an urgent need to urinate. °· Bruising, swelling, and tenderness of the area behind the scrotum (perineum). °· Bloating and gas. °· Fatigue. °· Burning or pain in the rectum. °· Problems getting or keeping an erection (erectile dysfunction). °· Nausea. °Follow these instructions at home: °Managing pain, stiffness, and swelling °· If directed, apply ice to the affected area: °? Put ice in a plastic bag. °? Place a towel between your skin and the bag. °? Leave the ice on for 20 minutes, 2-3 times a day. °· Try not to sit directly on the area behind the scrotum. A soft cushion can help with discomfort. °Activity °· Do not drive for 24 hours if you were given a medicine to help you relax (sedative). °· Do not drive or use heavy machinery while taking prescription pain medicine. °· Rest as told by your health care provider. °· Most people can return to normal activities a few days or weeks after the procedure. Ask your health care provider what activities are safe for you. °Eating and drinking °· Drink enough fluid to keep your urine clear or pale yellow. °· Eat a healthy, balanced diet. This includes lean proteins, whole grains, and plenty of fruits and vegetables. °General instructions °· Take over-the-counter and prescription medicines only as told by your health care provider. °· Keep all follow-up visits as told by your health care provider. This is important. You may still need additional treatment. °· Do not take baths, swim, or use a hot tub until your health  care provider approves. Shower and wash the area behind the scrotum gently. °· Do not have sex for one week after the treatment, or until your health care provider approves. °· If you have permanent, low-dose brachytherapy implants: °? Limit close contact with children and pregnant women for 2 months or as told by your health care provider. This is important because of the radiation that is still active in the prostate. °? You may set off radioactive sensors, such as airport screenings. Ask your health care provider for a document that explains your treatment. °? You may be instructed to use a condom during sex for the first 2 months after low-dose brachytherapy. °Contact a health care provider if: °· You have a fever or chills. °· You do not have a bowel movement for 3-4 days after the procedure. °· You have diarrhea for 3-4 days after the procedure. °· You develop any new symptoms, such as problems with urinating or erectile dysfunction. °· You have abdomen (abdominal) pain. °· You have more blood in your urine. °Get help right away if: °· You cannot urinate. °· There is excessive bleeding from your rectum. °· You have unusual drainage coming from your rectum. °· You have severe pain in the treated area that does not go away with pain medicine. °· You have severe nausea or vomiting. °Summary °· If you have permanent, low-dose brachytherapy implants, limit close contact with children and pregnant women for 2 months or as told by your health care provider. This is important because of the radiation   that is still active in the prostate. °· Talk with your health care provider about your risk of brachytherapy side effects, such as erectile dysfunction or urinary problems. Your health care provider will be able to recommend possible treatment options. °· Keep all follow-up visits as told by your health care provider. This is important. You may need additional treatment. °This information is not intended to replace  advice given to you by your health care provider. Make sure you discuss any questions you have with your health care provider. °Document Revised: 09/19/2017 Document Reviewed: 11/08/2016 °Elsevier Patient Education © 2020 [employer] ° ° °Post Anesthesia Home Care Instructions ° °Activity: °Get plenty of rest for the remainder of the day. A responsible individual must stay with you for 24 hours following the procedure.  °For the next 24 hours, DO NOT: °-Drive a car °-Operate machinery °-Drink alcoholic beverages °-Take any medication unless instructed by your physician °-Make any legal decisions or sign important papers. ° °Meals: °Start with liquid foods such as gelatin or soup. Progress to regular foods as tolerated. Avoid greasy, spicy, heavy foods. If nausea and/or vomiting occur, drink only clear liquids until the nausea and/or vomiting subsides. Call your physician if vomiting continues. ° °Special Instructions/Symptoms: °Your throat may feel dry or sore from the anesthesia or the breathing tube placed in your throat during surgery. If this causes discomfort, gargle with warm salt water. The discomfort should disappear within 24 hours. ° °If you had a scopolamine patch placed behind your ear for the management of post- operative nausea and/or vomiting: ° °1. The medication in the patch is effective for 72 hours, after which it should be removed.  Wrap patch in a tissue and discard in the trash. Wash hands thoroughly with soap and water. °2. You may remove the patch earlier than 72 hours if you experience unpleasant side effects which may include dry mouth, dizziness or visual disturbances. °3. Avoid touching the patch. Wash your hands with soap and water after contact with the patch. °   ° ° °

## 2020-01-13 NOTE — Anesthesia Preprocedure Evaluation (Addendum)
Anesthesia Evaluation  Patient identified by MRN, date of birth, ID band Patient awake    Reviewed: Allergy & Precautions, NPO status , Patient's Chart, lab work & pertinent test results  History of Anesthesia Complications Negative for: history of anesthetic complications  Airway Mallampati: II  TM Distance: >3 FB Neck ROM: Full    Dental no notable dental hx. (+) Dental Advisory Given   Pulmonary neg pulmonary ROS,    Pulmonary exam normal        Cardiovascular hypertension, Pt. on medications negative cardio ROS Normal cardiovascular exam     Neuro/Psych negative neurological ROS     GI/Hepatic Neg liver ROS, GERD  ,  Endo/Other  negative endocrine ROS  Renal/GU negative Renal ROS     Musculoskeletal negative musculoskeletal ROS (+)   Abdominal   Peds  Hematology negative hematology ROS (+)   Anesthesia Other Findings Day of surgery medications reviewed with the patient.  Reproductive/Obstetrics                            Anesthesia Physical Anesthesia Plan  ASA: II  Anesthesia Plan: General   Post-op Pain Management:    Induction: Intravenous  PONV Risk Score and Plan: 3 and Ondansetron, Dexamethasone and Midazolam  Airway Management Planned: LMA  Additional Equipment:   Intra-op Plan:   Post-operative Plan: Extubation in OR  Informed Consent: I have reviewed the patients History and Physical, chart, labs and discussed the procedure including the risks, benefits and alternatives for the proposed anesthesia with the patient or authorized representative who has indicated his/her understanding and acceptance.     Dental advisory given  Plan Discussed with: CRNA and Anesthesiologist  Anesthesia Plan Comments:        Anesthesia Quick Evaluation

## 2020-01-13 NOTE — Anesthesia Postprocedure Evaluation (Signed)
Anesthesia Post Note  Patient: Stephen Ayers  Procedure(s) Performed: RADIOACTIVE SEED IMPLANT/BRACHYTHERAPY IMPLANT (N/A Prostate) SPACE OAR INSTILLATION (N/A Prostate)     Patient location during evaluation: PACU Anesthesia Type: General Level of consciousness: sedated Pain management: pain level controlled Vital Signs Assessment: post-procedure vital signs reviewed and stable Respiratory status: spontaneous breathing and respiratory function stable Cardiovascular status: stable Postop Assessment: no apparent nausea or vomiting Anesthetic complications: no    Last Vitals:  Vitals:   01/13/20 1145 01/13/20 1155  BP: 125/79   Pulse: 76 76  Resp: 11 13  Temp:    SpO2: 95% 99%    Last Pain:  Vitals:   01/13/20 1145  TempSrc:   PainSc: 3                  [name] DANIEL

## 2020-01-14 NOTE — Progress Notes (Signed)
  Radiation Oncology         [phone] ________________________________  Name: Stephen Ayers MRN: [id number]  Date: 01/14/2020  DOB: [birth year]       Prostate Seed Implant  [id number], Gwyndolyn Saxon, MD  No ref. provider found  DIAGNOSIS: 59 y.o. gentleman with Stage T1c adenocarcinoma of the prostate with Gleason score of 3+4, and PSA of 5.98.  PROCEDURE: Insertion of radioactive I-125 seeds into the prostate gland.  RADIATION DOSE: 145 Gy, definitive therapy.  TECHNIQUE: [name] was brought to the operating room with the urologist. He was placed in the dorsolithotomy position. He was catheterized and a rectal tube was inserted. The perineum was shaved, prepped and draped. The ultrasound probe was then introduced into the rectum to see the prostate gland.  TREATMENT DEVICE: A needle grid was attached to the ultrasound probe stand and anchor needles were placed.  3D PLANNING: The prostate was imaged in 3D using a sagittal sweep of the prostate probe. These images were transferred to the planning computer. There, the prostate, urethra and rectum were defined on each axial reconstructed image. Then, the software created an optimized 3D plan and a few seed positions were adjusted. The quality of the plan was reviewed using [facility] information for the target and the following two organs at risk:  Urethra and Rectum.  Then the accepted plan was printed and handed off to the radiation therapist.  Under my supervision, the custom loading of the seeds and spacers was carried out and loaded into sealed vicryl sleeves.  These pre-loaded needles were then placed into the needle holder.Marland Kitchen  PROSTATE VOLUME STUDY:  Using transrectal ultrasound the volume of the prostate was verified to be 32 cc.  SPECIAL TREATMENT PROCEDURE/SUPERVISION AND HANDLING: The pre-loaded needles were then delivered under sagittal guidance. A total of 19 needles were used to deposit 64 seeds in the prostate gland. The individual  seed activity was 0.415 mCi.  SpaceOAR:  Yes  COMPLEX SIMULATION: At the end of the procedure, an anterior radiograph of the pelvis was obtained to document seed positioning and count. Cystoscopy was performed to check the urethra and bladder.  MICRODOSIMETRY: At the end of the procedure, the patient was emitting 0.128 mR/hr at 1 meter. Accordingly, he was considered safe for hospital discharge.  PLAN: The patient will return to the radiation oncology clinic for post implant CT dosimetry in three weeks.   ________________________________  Sheral Apley Tammi Klippel, M.D.

## 2020-01-26 ENCOUNTER — Telehealth: Payer: Self-pay | Admitting: *Deleted

## 2020-01-26 NOTE — Telephone Encounter (Signed)
Called patient to remind of post seed appts. and MRI for 01-27-20, spoke with patient and he is aware of these appts.

## 2020-01-27 ENCOUNTER — Ambulatory Visit
Admission: RE | Admit: 2020-01-27 | Discharge: 2020-01-27 | Disposition: A | Discharge status: Home / Self Care | Payer: BC Managed Care – PPO | Source: Ambulatory Visit | Attending: Urology | Admitting: Urology

## 2020-01-27 ENCOUNTER — Encounter: Payer: Self-pay | Admitting: Urology

## 2020-01-27 ENCOUNTER — Ambulatory Visit
Admission: RE | Admit: 2020-01-27 | Discharge: 2020-01-27 | Disposition: A | Discharge status: Home / Self Care | Payer: BC Managed Care – PPO | Source: Ambulatory Visit | Attending: Radiation Oncology | Admitting: Radiation Oncology

## 2020-01-27 ENCOUNTER — Ambulatory Visit (HOSPITAL_COMMUNITY)
Admission: RE | Admit: 2020-01-27 | Discharge: 2020-01-27 | Disposition: A | Discharge status: Home / Self Care | Payer: BC Managed Care – PPO | Source: Ambulatory Visit | Attending: Urology | Admitting: Urology

## 2020-01-27 ENCOUNTER — Encounter: Payer: Self-pay | Admitting: Medical Oncology

## 2020-01-27 ENCOUNTER — Other Ambulatory Visit: Payer: Self-pay

## 2020-01-27 VITALS — BP 129/82 | HR 77 | Temp 98.5°F | Resp 20 | Ht 67.0 in | Wt 175.2 lb

## 2020-01-27 DIAGNOSIS — C61 Malignant neoplasm of prostate: Secondary | ICD-10-CM

## 2020-01-27 NOTE — Progress Notes (Signed)
Radiation Oncology         [phone] ________________________________  Name: Stephen Ayers MRN: [id number]  Date: 01/27/2020  DOB: 1960-11-07  Post-Seed Follow-Up Visit Note  CC: Shirline Frees, MD  Irine Seal, MD  Diagnosis:   59 y.o. gentleman with Stage T1c adenocarcinoma of the prostate with Gleason score of 3+4, and PSA of 5.98.    ICD-10-CM   1. Malignant neoplasm of prostate (HCC)  C61     Interval Since Last Radiation:  2 weeks 01/13/20:  Insertion of radioactive I-125 seeds into the prostate gland; 145 Gy, definitive therapy with placement of SpaceOAR gel.  Narrative:  The patient returns today for routine follow-up.  He is complaining of increased urinary frequency and urinary hesitation symptoms. He filled out a questionnaire regarding urinary function today providing and overall IPSS score of 11 characterizing his symptoms as mild-moderate with weak flow of stream and occasional sensation of not emptying his bladder fully with voiding.  He specifically denies dysuria, gross hematuria, straining to void, excessive daytime frequency, urgency or incontinence.  He has nocturia x1 per night which is normal for him.  His pre-implant score was 4, on Flomax. He denies any bowel symptoms and reports a healthy appetite.  He has noticed some mild decreased stamina but feels that this is gradually improving.  ALLERGIES:  has No Known Allergies.  Meds: Current Outpatient Medications  Medication Sig Dispense Refill  . acetaminophen (TYLENOL) 325 MG tablet Take 650 mg by mouth every 6 (six) hours as needed.    Marland Kitchen atorvastatin (LIPITOR) 20 MG tablet Take 20 mg by mouth daily.    . Chlorphen-Pseudoephed-APAP (TYLENOL ALLERGY SINUS PO) Take 1 tablet by mouth every 4 (four) hours as needed (allergies).    Marland Kitchen HYDROcodone-acetaminophen (NORCO) 5-325 MG tablet Take 1 tablet by mouth every 6 (six) hours as needed for moderate pain. 6 tablet 0  . losartan-hydrochlorothiazide (HYZAAR) 50-12.5 MG  per tablet Take 1 tablet by mouth daily.    . naproxen (NAPROSYN) 500 MG tablet as needed.     . tamsulosin (FLOMAX) 0.4 MG CAPS capsule daily.      No current facility-administered medications for this visit.    Physical Findings: In general this is a well appearing Caucasian male in no acute distress. He's alert and oriented x4 and appropriate throughout the examination. Cardiopulmonary assessment is negative for acute distress and he exhibits normal effort.   Lab Findings: Lab Results  Component Value Date   WBC 4.7 01/10/2020   HGB 13.8 01/10/2020   HCT 40.3 01/10/2020   MCV 91.2 01/10/2020   PLT 259 01/10/2020    Radiographic Findings:  Patient underwent CT imaging in our clinic for post implant dosimetry. The CT will be reviewed by Dr. Tammi Klippel to confirm there is an adequate distribution of radioactive seeds throughout the prostate gland and ensure that there are no seeds in or near the rectum. His scheduled for prostate MRI following our visit today and those images will be fused with his CT images for further evaluation. We suspect the final radiation plan and dosimetry will show appropriate coverage of the prostate gland. He understands that we will call and inform him of any unexpected findings on further review of his imaging and dosimetry.  Impression/Plan: 59 y.o. gentleman with Stage T1c adenocarcinoma of the prostate with Gleason score of 3+4, and PSA of 5.98. The patient is recovering from the effects of radiation. His urinary symptoms should gradually improve over the next 4-6  months. We talked about this today. He is encouraged by his improvement already and is otherwise pleased with his outcome. We also talked about long-term follow-up for prostate cancer following seed implant. He understands that ongoing PSA determinations and digital rectal exams will help perform surveillance to rule out disease recurrence. He has a follow up appointment scheduled at [facility]  with Jiles Crocker, NP on 02/03/20 and anticipate a follow up with Dr. Jeffie Pollock in 03/2020 with labs prior to that visit. He understands what to expect with his PSA measures. Patient was also educated today about some of the long-term effects from radiation including a small risk for rectal bleeding and possibly erectile dysfunction. We talked about some of the general management approaches to these potential complications. However, I did encourage the patient to contact our office or return at any point if he has questions or concerns related to his previous radiation and prostate cancer.    Nicholos Johns, PA-C

## 2020-01-29 ENCOUNTER — Ambulatory Visit: Payer: BC Managed Care – PPO | Attending: Internal Medicine

## 2020-01-29 DIAGNOSIS — Z23 Encounter for immunization: Secondary | ICD-10-CM

## 2020-01-29 NOTE — Progress Notes (Signed)
   Covid-19 Vaccination Clinic  Name:  Stephen Ayers    MRN: [id number] DOB: Jul 08, 1961  01/29/2020  [name] was observed post Covid-19 immunization for 15 minutes without incident. He was provided with Vaccine Information Sheet and instruction to access the V-Safe system.   [name] was instructed to call 911 with any severe reactions post vaccine: Marland Kitchen Difficulty breathing  . Swelling of face and throat  . A fast heartbeat  . A bad rash all over body  . Dizziness and weakness   Immunizations Administered    Name Date Dose VIS Date Route   Pfizer COVID-19 Vaccine 01/29/2020 10:37 AM 0.3 mL 10/01/2019 Intramuscular   Manufacturer: [place]   Lot: [id number]   [place]: [id number]

## 2020-01-31 NOTE — Progress Notes (Signed)
  Radiation Oncology         [phone] ________________________________  Name: Stephen Ayers MRN: [id number]  Date: 01/27/2020  DOB: [birth year]  COMPLEX SIMULATION NOTE  NARRATIVE:  The patient was brought to the [facility] suite today following prostate seed implantation approximately one month ago.  Identity was confirmed.  All relevant records and images related to the planned course of therapy were reviewed.  Then, the patient was set-up supine.  CT images were obtained.  The CT images were loaded into the planning software.  Then the prostate and rectum were contoured.  Treatment planning then occurred.  The implanted iodine 125 seeds were identified by the physics staff for projection of radiation distribution  I have requested : 3D Simulation  I have requested a DVH of the following structures: Prostate and rectum.    ________________________________  Sheral Apley Tammi Klippel, M.D.

## 2020-02-03 DIAGNOSIS — C61 Malignant neoplasm of prostate: Secondary | ICD-10-CM | POA: Diagnosis not present

## 2020-02-18 DIAGNOSIS — L570 Actinic keratosis: Secondary | ICD-10-CM | POA: Diagnosis not present

## 2020-02-21 ENCOUNTER — Ambulatory Visit: Payer: BC Managed Care – PPO | Attending: Internal Medicine

## 2020-02-21 ENCOUNTER — Encounter: Payer: Self-pay | Admitting: Radiation Oncology

## 2020-02-21 DIAGNOSIS — C61 Malignant neoplasm of prostate: Secondary | ICD-10-CM | POA: Diagnosis not present

## 2020-02-21 DIAGNOSIS — Z23 Encounter for immunization: Secondary | ICD-10-CM

## 2020-02-21 NOTE — Progress Notes (Signed)
   Covid-19 Vaccination Clinic  Name:  Stephen Ayers    MRN: [id number] DOB: 07-Feb-1961  02/21/2020  [name] was observed post Covid-19 immunization for 15 minutes without incident. He was provided with Vaccine Information Sheet and instruction to access the V-Safe system.   [name] was instructed to call 911 with any severe reactions post vaccine: Marland Kitchen Difficulty breathing  . Swelling of face and throat  . A fast heartbeat  . A bad rash all over body  . Dizziness and weakness   Immunizations Administered    Name Date Dose VIS Date [place] COVID-19 Vaccine 02/21/2020 12:20 PM 0.3 mL 12/15/2018 Intramuscular   Manufacturer: [employer]   Lot: [id number]   [place]: [id number]

## 2020-02-21 NOTE — Progress Notes (Signed)
   Covid-19 Vaccination Clinic  Name:  Stephen Ayers    MRN: [id number] DOB: 1961/05/28  02/21/2020  [name] was observed post Covid-19 immunization for 15 minutes without incident. He was provided with Vaccine Information Sheet and instruction to access the V-Safe system.   [name] was instructed to call 911 with any severe reactions post vaccine: Marland Kitchen Difficulty breathing  . Swelling of face and throat  . A fast heartbeat  . A bad rash all over body  . Dizziness and weakness   Immunizations Administered    Name Date Dose VIS Date [name] COVID-19 Vaccine 02/21/2020 12:20 PM 0.3 mL 12/15/2018 Intramuscular   Manufacturer: [place]   Lot: [id number]   [place]: [id number]

## 2020-02-28 DIAGNOSIS — L570 Actinic keratosis: Secondary | ICD-10-CM | POA: Diagnosis not present

## 2020-02-28 DIAGNOSIS — X32XXXA Exposure to sunlight, initial encounter: Secondary | ICD-10-CM | POA: Diagnosis not present

## 2020-03-02 DIAGNOSIS — J0111 Acute recurrent frontal sinusitis: Secondary | ICD-10-CM | POA: Diagnosis not present

## 2020-03-02 DIAGNOSIS — J3089 Other allergic rhinitis: Secondary | ICD-10-CM | POA: Diagnosis not present

## 2020-03-15 DIAGNOSIS — J3081 Allergic rhinitis due to animal (cat) (dog) hair and dander: Secondary | ICD-10-CM | POA: Diagnosis not present

## 2020-03-15 DIAGNOSIS — J309 Allergic rhinitis, unspecified: Secondary | ICD-10-CM | POA: Diagnosis not present

## 2020-03-15 DIAGNOSIS — J301 Allergic rhinitis due to pollen: Secondary | ICD-10-CM | POA: Diagnosis not present

## 2020-03-15 DIAGNOSIS — J3089 Other allergic rhinitis: Secondary | ICD-10-CM | POA: Diagnosis not present

## 2020-03-21 NOTE — Progress Notes (Signed)
  Radiation Oncology         [phone]) [phone] ________________________________  Name: Stephen Ayers MRN: [id number]  Date: 02/21/2020  DOB: 1961/04/18  3D Planning Note   Prostate Brachytherapy Post-Implant Dosimetry  Diagnosis: 59 y.o. gentleman with Stage T1c adenocarcinoma of the prostate with Gleason score of 3+4, and PSA of 5.98  Narrative: On a previous date, Stephen Ayers returned following prostate seed implantation for post implant planning. He underwent CT scan complex simulation to delineate the three-dimensional structures of the pelvis and demonstrate the radiation distribution.  Since that time, the seed localization, and complex isodose planning with dose volume histograms have now been completed.  Results:   Prostate Coverage - The dose of radiation delivered to the 90% or more of the prostate gland (D90) was 101.85% of the prescription dose. This exceeds our goal of greater than 90%. Rectal Sparing - The volume of rectal tissue receiving the prescription dose or higher was 0.0 cc. This falls under our thresholds tolerance of 1.0 cc.  Impression: The prostate seed implant appears to show adequate target coverage and appropriate rectal sparing.  Plan:  The patient will continue to follow with urology for ongoing PSA determinations. I would anticipate a high likelihood for local tumor control with minimal risk for rectal morbidity.  ________________________________  Sheral Apley Tammi Klippel, M.D.

## 2020-04-26 DIAGNOSIS — C61 Malignant neoplasm of prostate: Secondary | ICD-10-CM | POA: Diagnosis not present

## 2020-05-03 DIAGNOSIS — Z8546 Personal history of malignant neoplasm of prostate: Secondary | ICD-10-CM | POA: Diagnosis not present

## 2020-05-03 DIAGNOSIS — N401 Enlarged prostate with lower urinary tract symptoms: Secondary | ICD-10-CM | POA: Diagnosis not present

## 2020-05-03 DIAGNOSIS — R3912 Poor urinary stream: Secondary | ICD-10-CM | POA: Diagnosis not present

## 2020-05-03 DIAGNOSIS — R972 Elevated prostate specific antigen [PSA]: Secondary | ICD-10-CM | POA: Diagnosis not present

## 2020-05-23 ENCOUNTER — Emergency Department (HOSPITAL_COMMUNITY)
Admission: EM | Admit: 2020-05-23 | Discharge: 2020-05-23 | Disposition: A | Discharge status: Home / Self Care | Payer: BC Managed Care – PPO | Attending: Emergency Medicine | Admitting: Emergency Medicine

## 2020-05-23 ENCOUNTER — Encounter (HOSPITAL_COMMUNITY): Payer: Self-pay | Admitting: Emergency Medicine

## 2020-05-23 ENCOUNTER — Other Ambulatory Visit: Payer: Self-pay

## 2020-05-23 ENCOUNTER — Emergency Department (HOSPITAL_COMMUNITY): Payer: BC Managed Care – PPO

## 2020-05-23 DIAGNOSIS — R079 Chest pain, unspecified: Secondary | ICD-10-CM | POA: Insufficient documentation

## 2020-05-23 DIAGNOSIS — I10 Essential (primary) hypertension: Secondary | ICD-10-CM | POA: Insufficient documentation

## 2020-05-23 DIAGNOSIS — Z8546 Personal history of malignant neoplasm of prostate: Secondary | ICD-10-CM | POA: Insufficient documentation

## 2020-05-23 DIAGNOSIS — R0789 Other chest pain: Secondary | ICD-10-CM | POA: Diagnosis not present

## 2020-05-23 LAB — TROPONIN I (HIGH SENSITIVITY)
Troponin I (High Sensitivity): <2 ng/L (ref <18)
Troponin I (High Sensitivity): <2 ng/L (ref <18)

## 2020-05-23 LAB — BASIC METABOLIC PANEL
Anion gap: 7 (ref 5–15)
BUN: 16 mg/dL (ref 8–23)
CO2: 28 mmol/L (ref 22–32)
Calcium: 9.5 mg/dL (ref 8.9–10.3)
Chloride: 104 mmol/L (ref 98–111)
Creatinine, Ser: 1.1 mg/dL (ref 0.61–1.24)
GFR calc Af Amer: >60 mL/min (ref >60)
GFR calc non Af Amer: >60 mL/min (ref >60)
Glucose, Bld: 110 mg/dL — ABNORMAL HIGH (ref 70–99)
Potassium: 3.7 mmol/L (ref 3.5–5.1)
Sodium: 139 mmol/L (ref 135–145)

## 2020-05-23 LAB — CBC
HCT: 41.1 % (ref 39.0–52.0)
Hemoglobin: 13.9 g/dL (ref 13.0–17.0)
MCH: 31.8 pg (ref 26.0–34.0)
MCHC: 33.8 g/dL (ref 30.0–36.0)
MCV: 94.1 fL (ref 80.0–100.0)
Platelets: 252 10*3/uL (ref 150–400)
RBC: 4.37 MIL/uL (ref 4.22–5.81)
RDW: 12.7 % (ref 11.5–15.5)
WBC: 4.8 10*3/uL (ref 4.0–10.5)
nRBC: 0 % (ref 0.0–0.2)

## 2020-05-23 MED ORDER — SODIUM CHLORIDE 0.9% FLUSH: 3.0000 mL | Freq: Once | INTRAVENOUS | Status: DC

## 2020-05-23 NOTE — ED Triage Notes (Signed)
Pt reports a burning in his left pec muscle that has been present for over 1 week seems to come and go. Pt denies any syncope, sweating n/v.

## 2020-05-23 NOTE — Discharge Instructions (Addendum)
You were evaluated in the Emergency Department and after careful evaluation, we did not find any emergent condition requiring admission or further testing in the hospital.  Your exam/testing today was overall reassuring.  As discussed, we recommend follow-up with your cardiologist to further discuss your symptoms and see if you need another outpatient stress test.  Please return to the Emergency Department if you experience any worsening of your condition.  Thank you for allowing [place] to be a part of your care.

## 2020-05-23 NOTE — ED Provider Notes (Signed)
[facility] Emergency Department Provider Note MRN:  [id number]  Arrival date & time: 05/23/20     Chief Complaint   Chest Pain   History of Present Illness   Stephen Ayers is a 59 y.o. year-old male with a history of hypertension, hyperlipidemia presenting to the ED with chief complaint of chest pain.  Location: Left pectoral region Duration: 1 year but worse over the past 1 or 2 weeks Onset: Gradual Timing: Intermittent Description: Sharp Severity: Mild to moderate Exacerbating/Alleviating Factors: None Associated Symptoms: None Pertinent Negatives: Denies headache or vision change, no dizziness or diaphoresis, no nausea or vomiting, no shortness of breath, no leg pain or swelling   Review of Systems  A complete 10 system review of systems was obtained and all systems are negative except as noted in the HPI and PMH.   Patient's Health History    Past Medical History:  Diagnosis Date  . Arthritis    oa right hip and shoulder  . GERD (gastroesophageal reflux disease)   . Headache    from allergies  . Hypertension   . Irregular heart beat pac no cardiologist   saw cardiology dr Stephen Ayers in past and released  . Prostate cancer [facility])     Past Surgical History:  Procedure Laterality Date  . colonscopy    . PROSTATE BIOPSY    . RADIOACTIVE SEED IMPLANT N/A 01/13/2020   Procedure: RADIOACTIVE SEED IMPLANT/BRACHYTHERAPY IMPLANT;  Surgeon: Stephen Seal, MD;  Location: [facility] ([place]);  Service: Urology;  Laterality: N/A;  . SPACE OAR INSTILLATION N/A 01/13/2020   Procedure: SPACE OAR INSTILLATION;  Surgeon: Stephen Seal, MD;  Location: [facility];  Service: Urology;  Laterality: N/A;  . WISDOM TOOTH EXTRACTION      Family History  Problem Relation Age of Onset  . Kidney failure Father   . Mitral valve prolapse Mother   . Cancer Paternal Uncle        unknown  . Breast cancer Neg Hx   . Prostate cancer Neg Hx   . Colon  cancer Neg Hx   . Pancreatic cancer Neg Hx     Social History   Socioeconomic History  . Marital status: Single    Spouse name: Not on file  . Number of children: Not on file  . Years of education: Not on file  . Highest education level: Not on file  Occupational History  . Not on file  Tobacco Use  . Smoking status: Never Smoker  . Smokeless tobacco: Never Used  Vaping Use  . Vaping Use: Never used  Substance and Sexual Activity  . Alcohol use: Yes    Comment: 1 glass wine  nightly  . Drug use: No  . Sexual activity: Yes  Other Topics Concern  . Not on file  Social History Narrative  . Not on file   Social Determinants of Health   Financial Resource Strain:   . Difficulty of Paying Living Expenses:   Food Insecurity:   . Worried About Charity fundraiser in the Last Year:   . Arboriculturist in the Last Year:   Transportation Needs:   . Film/video editor (Medical):   Marland Kitchen Lack of Transportation (Non-Medical):   Physical Activity:   . Days of Exercise per Week:   . Minutes of Exercise per Session:   Stress:   . Feeling of Stress :   Social Connections:   . Frequency of Communication with Friends  and Family:   . Frequency of Social Gatherings with Friends and Family:   . Attends Religious Services:   . Active Member of Clubs or Organizations:   . Attends Archivist Meetings:   Marland Kitchen Marital Status:   Intimate Partner Violence:   . Fear of Current or Ex-Partner:   . Emotionally Abused:   Marland Kitchen Physically Abused:   . Sexually Abused:      Physical Exam   Vitals:   05/23/20 1412 05/23/20 1720  BP: 120/83 126/78  Pulse: 71 72  Resp: 16 16  Temp:    SpO2: 96% 97%    CONSTITUTIONAL: Well-appearing, NAD NEURO:  Alert and oriented x 3, no focal deficits EYES:  eyes equal and reactive ENT/NECK:  no LAD, no JVD CARDIO: Regular rate, well-perfused, normal S1 and S2 PULM:  CTAB no wheezing or rhonchi GI/GU:  normal bowel sounds, non-distended,  non-tender MSK/SPINE:  No gross deformities, no edema SKIN:  no rash, atraumatic PSYCH:  Appropriate speech and behavior  *Additional and/or pertinent findings included in MDM below  Diagnostic and Interventional Summary    EKG Interpretation  Date/Time:  Tuesday May 23 2020 11:33:45 EDT Ventricular Rate:  96 PR Interval:  150 QRS Duration: 100 QT Interval:  358 QTC Calculation: 452 R Axis:   -13 Text Interpretation: Normal sinus rhythm Incomplete right bundle branch block Nonspecific ST abnormality Abnormal ECG Confirmed by Stephen Ayers [phone]) on 05/23/2020 7:25:41 PM      Labs Reviewed  BASIC METABOLIC PANEL - Abnormal; Notable for the following components:      Result Value   Glucose, Bld 110 (*)    All other components within normal limits  CBC  TROPONIN I (HIGH SENSITIVITY)  TROPONIN I (HIGH SENSITIVITY)    DG Chest 2 View  Final Result      Medications  sodium chloride flush (NS) 0.9 % injection 3 mL (3 mLs Intravenous Not Given 05/23/20 1927)     Procedures  /  Critical Care Procedures  ED Course and Medical Decision Making  I have reviewed the triage vital signs, the nursing notes, and pertinent available records from the EMR.  Listed above are laboratory and imaging tests that I personally ordered, reviewed, and interpreted and then considered in my medical decision making (see below for details).      Atypical chest pain favoring musculoskeletal, EKG unchanged, troponin negative x2, heart score low, appropriate for discharge with cardiology follow-up.    Stephen Ayers. Stephen Ayers, [place] [email or web address]  Final Clinical Impressions(s) / ED Diagnoses     ICD-10-CM   1. Chest pain, unspecified type  R07.9     ED Discharge Orders    None       Discharge Instructions Discussed with and Provided to Patient:     Discharge Instructions     You were evaluated in the Emergency Department and  after careful evaluation, we did not find any emergent condition requiring admission or further testing in the hospital.  Your exam/testing today was overall reassuring.  As discussed, we recommend follow-up with your cardiologist to further discuss your symptoms and see if you need another outpatient stress test.  Please return to the Emergency Department if you experience any worsening of your condition.  Thank you for allowing [place] to be a part of your care.       Maudie Flakes, MD 05/23/20 [phone]

## 2020-05-28 NOTE — Progress Notes (Signed)
Cardiology Office Note:   Date:  06/01/2020  NAME:  Stephen Ayers    MRN: [id number] DOB:  Nov 08, 1960   PCP:  Shirline Frees, MD  Cardiologist:  No primary care provider on file.   Referring MD: Shirline Frees, MD   Chief Complaint  Patient presents with  . Chest Pain   History of Present Illness:   Stephen Ayers is a 59 y.o. male with a hx of HTN, HLD who is being seen today for the evaluation of chest pain at the request of Shirline Frees, MD. Recent evaluation in the ER 8/3 for CP. Ruled out for ACS and sent to [place]. ETT 2016 for atypical CP normal.   He reports he has had intermittent episodes of chest pain on and off for years.  He was seen by cardiology in 2016 had a normal exercise treadmill stress test.  On August 2 he reports he had left-sided chest pain described as tightness while sitting.  He reports the pain went into his left arm and jaw.  He reports nothing made the pain worse and nothing made the pain better.  There was no associated with exertion.  He went to the emergency room and ruled out for an acute coronary syndrome.  His EKG was normal.  He reports has had no further episodes of the pain.  He reports he had intermittent episodes of this on and off for years.  He is concerned about his heart.  CVD risk factors include hypertension which is well controlled on medication.  He does have hyperlipidemia with recent LDL 82 on statin.  He does have a family history of heart disease in his father and mother.  He also was treated recently for prostate cancer with brachytherapy.  He is a never smoker.  He consumes alcohol moderation.  No illicit drug use is reported.  He also endorses several months of rapid heartbeat sensation.  He reports he can get 2 episodes per day where his heart beats very fast for about 10 to 15 seconds.  There is no trigger that he is noticed.  He does not consume alcohol or caffeine in excess.  He reports his symptoms resolved without any intervention.   He does report he was diagnosed with PACs in the past.  He is also concerned he could be having an arrhythmia.  Most recent TSH from 2020 was 1.22.  EKG today is normal.  Cardiovascular exam is normal.   Labs from primary care physician office demonstrate total cholesterol 151, HDL 47, LDL 82, triglycerides 107 Serum creatinine 1.01 TSH 1.22  Past Medical History: Past Medical History:  Diagnosis Date  . Arthritis    oa right hip and shoulder  . GERD (gastroesophageal reflux disease)   . Headache    from allergies  . Hyperlipidemia   . Hypertension   . Irregular heart beat pac no cardiologist   saw cardiology dr Ellwood Sayers in past and released  . Prostate cancer ([place])   . Prostate cancer [facility])     Past Surgical History: Past Surgical History:  Procedure Laterality Date  . colonscopy    . PROSTATE BIOPSY    . RADIOACTIVE SEED IMPLANT N/A 01/13/2020   Procedure: RADIOACTIVE SEED IMPLANT/BRACHYTHERAPY IMPLANT;  Surgeon: Irine Seal, MD;  Location[facility];  Service: Urology;  Laterality: N/A;  . SPACE OAR INSTILLATION N/A 01/13/2020   Procedure: SPACE OAR INSTILLATION;  Surgeon: Irine Seal, MD;  [facility];  Service: Urology;  Laterality: N/A;  . WISDOM TOOTH EXTRACTION      Current Medications: Current Meds  Medication Sig  . acetaminophen (TYLENOL) 325 MG tablet Take 650 mg by mouth every 6 (six) hours as needed.   Marland Kitchen atorvastatin (LIPITOR) 20 MG tablet Take 20 mg by mouth daily.  . Chlorphen-Pseudoephed-APAP (TYLENOL ALLERGY SINUS PO) Take 1 capsule by mouth every 4 (four) hours as needed (allergies).   . fluticasone (FLONASE) 50 MCG/ACT nasal spray Place 2 sprays into both nostrils in the morning.   Marland Kitchen losartan-hydrochlorothiazide (HYZAAR) 50-12.5 MG per tablet Take 1 tablet by mouth daily.  . naproxen (NAPROSYN) 500 MG tablet Take 500 mg by mouth 3 (three) times daily as needed for mild pain.   . tamsulosin (FLOMAX) 0.4 MG CAPS  capsule Take 0.8 mg by mouth daily.      Allergies:    Other and Tree extract   Social History: Social History   Socioeconomic History  . Marital status: Married    Spouse name: Not on file  . Number of children: Not on file  . Years of education: Not on file  . Highest education level: Not on file  Occupational History  . Occupation: IT  Tobacco Use  . Smoking status: Never Smoker  . Smokeless tobacco: Never Used  Vaping Use  . Vaping Use: Never used  Substance and Sexual Activity  . Alcohol use: Yes    Comment: 1 glass wine  nightly  . Drug use: No  . Sexual activity: Yes  Other Topics Concern  . Not on file  Social History Narrative  . Not on file   Social Determinants of Health   Financial Resource Strain:   . Difficulty of Paying Living Expenses:   Food Insecurity:   . Worried About Charity fundraiser in the Last Year:   . Arboriculturist in the Last Year:   Transportation Needs:   . Film/video editor (Medical):   Marland Kitchen Lack of Transportation (Non-Medical):   Physical Activity:   . Days of Exercise per Week:   . Minutes of Exercise per Session:   Stress:   . Feeling of Stress :   Social Connections:   . Frequency of Communication with Friends and Family:   . Frequency of Social Gatherings with Friends and Family:   . Attends Religious Services:   . Active Member of Clubs or Organizations:   . Attends Archivist Meetings:   Marland Kitchen Marital Status:      Family History: The patient's family history includes Cancer in his paternal uncle; Heart disease in his father and mother; Kidney failure in his father; Mitral valve prolapse in his mother. There is no history of Breast cancer, Prostate cancer, Colon cancer, or Pancreatic cancer.  ROS:   All other ROS reviewed and negative. Pertinent positives noted in the HPI.     EKGs/Labs/Other Studies Reviewed:   The following studies were personally reviewed by me today:  EKG:  EKG is ordered today.  The  ekg ordered today demonstrates normal sinus rhythm, heart rate 63, no acute ST-T changes, no evidence of prior infarction, and was personally reviewed by me.   Recent Labs: 01/10/2020: ALT 32 05/23/2020: BUN 16; Creatinine, Ser 1.10; Hemoglobin 13.9; Platelets 252; Potassium 3.7; Sodium 139   Recent Lipid Panel No results found for: CHOL, TRIG, HDL, CHOLHDL, VLDL, LDLCALC, LDLDIRECT  Physical Exam:   VS:  BP 122/78   Pulse 63   Temp (!) 97.3 F (  36.3 C)   Ht '5\' 6"'$  (1.676 m)   Wt 169 lb 12.8 oz (77 kg)   SpO2 98%   BMI 27.41 kg/m    Wt Readings from Last 3 Encounters:  06/01/20 169 lb 12.8 oz (77 kg)  05/23/20 175 lb 4.3 oz (79.5 kg)  01/27/20 175 lb 3.2 oz (79.5 kg)    General: Well nourished, well developed, in no acute distress Heart: Atraumatic, normal size  Eyes: PEERLA, EOMI  Neck: Supple, no JVD Endocrine: No thryomegaly Cardiac: Normal S1, S2; RRR; no murmurs, rubs, or gallops Lungs: Clear to auscultation bilaterally, no wheezing, rhonchi or rales  Abd: Soft, nontender, no hepatomegaly  Ext: No edema, pulses 2+ Musculoskeletal: No deformities, BUE and BLE strength normal and equal Skin: Warm and dry, no rashes   Neuro: Alert and oriented to person, place, time, and situation, CNII-XII grossly intact, no focal deficits  Psych: Normal mood and affect   ASSESSMENT:   Stephen Ayers is a 59 y.o. male who presents for the following: 1. Chest pain, unspecified type   2. Palpitations     PLAN:   1. Chest pain, unspecified type -Atypical chest pain on and off for years.  Recent emergency room visit negative for an acute coronary syndrome.  Exercise treadmill in 2016 normal.  EKG today normal.  Cardiovascular exam normal.  I recommended a coronary CTA to definitively exclude effective CAD.  His symptoms are quite atypical.  He will take 50 mg metoprolol tartrate 2 hours before the scan.  He reports he had an echocardiogram but I not see him in the system.  If anything is  abnormal we will proceed with that.  2. Palpitations -Palpitations for months.  Occur 2 times daily.  Last 10 to 15 seconds.  History of PACs.  I would like to recheck a TSH today.  Thyroid studies in August 2020 were normal.  I would also like for him to wear a 3-day Zio patch.  His EKG is normal today.  His cardiovascular exam is benign.  We need to exclude arrhythmia.   Disposition: Return in about 3 months (around 09/01/2020).  Medication Adjustments/Labs and Tests Ordered: Current medicines are reviewed at length with the patient today.  Concerns regarding medicines are outlined above.  Orders Placed This Encounter  Procedures  . CT CORONARY MORPH W/CTA COR W/SCORE W/CA W/CM &/OR WO/CM  . CT CORONARY FRACTIONAL FLOW RESERVE DATA PREP  . CT CORONARY FRACTIONAL FLOW RESERVE FLUID ANALYSIS  . TSH  . LONG TERM MONITOR (3-14 DAYS)  . EKG 12-Lead   Meds ordered this encounter  Medications  . metoprolol tartrate (LOPRESSOR) 50 MG tablet    Sig: Take 1 tablet by mouth once for procedure.    Dispense:  1 tablet    Refill:  0    Patient Instructions  Medication Instructions:  Take Metoprolol 50 mg two hours before CT when scheduled.   *If you need a refill on your cardiac medications before your next appointment, please call your pharmacy*   Lab Work: TSH today  If you have labs (blood work) drawn today and your tests are completely normal, you will receive your results only by: Marland Kitchen MyChart Message (if you have MyChart) OR . A paper copy in the mail If you have any lab test that is abnormal or we need to change your treatment, we will call you to review the results.   Testing/Procedures: Your physician has requested that you have cardiac CT. Cardiac  computed tomography (CT) is a painless test that uses an x-ray machine to take clear, detailed pictures of your heart. For further information please visit [email or web address].[email or web address]. Please follow instruction sheet as given.  Your  physician has recommended that you wear a 3 DAY ZIO-PATCH monitor. The Zio patch cardiac monitor continuously records heart rhythm data for up to 14 days, this is for patients being evaluated for multiple types heart rhythms. For the first 24 hours post application, please avoid getting the Zio monitor wet in the shower or by excessive sweating during exercise. After that, feel free to carry on with regular activities. Keep soaps and lotions away from the ZIO XT Patch.  This will be mailed to you, please expect 7-10 days to receive.    Applying the monitor   Shave hair from upper left chest.   Hold abrader disc by orange tab.  Rub abrader in 40 strokes over left upper chest as indicated in your monitor instructions.   Clean area with 4 enclosed alcohol pads .  Use all pads to assure are is cleaned thoroughly.  Let dry.   Apply patch as indicated in monitor instructions.  Patch will be place under collarbone on left side of chest with arrow pointing upward.   Rub patch adhesive wings for 2 minutes.Remove white label marked "1".  Remove white label marked "2".  Rub patch adhesive wings for 2 additional minutes.   While looking in a mirror, press and release button in center of patch.  A small green light will flash 3-4 times .  This will be your only indicator the monitor has been turned on.     Do not shower for the first 24 hours.  You may shower after the first 24 hours.   Press button if you feel a symptom. You will hear a small click.  Record Date, Time and Symptom in the Patient Log Book.   When you are ready to remove patch, follow instructions on last 2 pages of Patient Log Book.  Stick patch monitor onto last page of Patient Log Book.   Place Patient Log Book in [place] box.  Use locking tab on box and tape box closed securely.  The [employer] and [employer] has [employer] on it.  Please place in mailbox as soon as possible.  Your physician should have your test results approximately 7  days after the monitor has been mailed back to [facility].   Call [place] at [phone] if you have questions regarding your ZIO XT patch monitor.  Call them immediately if you see an orange light blinking on your monitor.   If your monitor falls off in less than 4 days contact our Monitor department at [phone].  If your monitor becomes loose or falls off after 4 days call [employer] at [phone] for suggestions on securing your monitor     Follow-Up: At [facility], you and your health needs are our priority.  As part of our continuing mission to provide you with exceptional heart care, we have created designated Provider Care Teams.  These Care Teams include your primary Cardiologist (physician) and Advanced Practice Providers (APPs -  Physician Assistants and Nurse Practitioners) who all work together to provide you with the care you need, when you need it.  We recommend signing up for the patient portal called "MyChart".  Sign up information is provided on this After Visit Summary.  MyChart is used to connect with patients for Virtual Visits (Telemedicine).  Patients are able to view lab/test results, encounter notes, upcoming appointments, etc.  Non-urgent messages can be sent to your provider as well.   To learn more about what you can do with MyChart, go to [email or web address].    Your next appointment:   3 month(s)  The format for your next appointment:   In Person  Provider:   Eleonore Chiquito, MD   Other Instructions  Your cardiac CT will be scheduled at one of the below locations:   [facility] [address] [place], [place] [address] [phone]  scheduled at [facility], please arrive at the [facility] main entrance of [facility] 30 minutes prior to test start time. Proceed to the [facility] Radiology Department (first floor) to check-in and test prep.  Please follow these instructions carefully  (unless otherwise directed):  Hold all erectile dysfunction medications at least 3 days (72 hrs) prior to test.  On the Night Before the Test: . Be sure to Drink plenty of water. . Do not consume any caffeinated/decaffeinated beverages or chocolate 12 hours prior to your test. . Do not take any antihistamines 12 hours prior to your test.  On the Day of the Test: . Drink plenty of water. Do not drink any water within one hour of the test. . Do not eat any food 4 hours prior to the test. . You may take your regular medications prior to the test.  . Take metoprolol (Lopressor) two hours prior to test. . HOLD Furosemide/Hydrochlorothiazide morning of the test.       After the Test: . Drink plenty of water. . After receiving IV contrast, you may experience a mild flushed feeling. This is normal. . On occasion, you may experience a mild rash up to 24 hours after the test. This is not dangerous. If this occurs, you can take Benadryl 25 mg and increase your fluid intake. . If you experience trouble breathing, this can be serious. If it is severe call 911 IMMEDIATELY. If it is mild, please call our office. . If you take any of these medications: Glipizide/Metformin, Avandament, Glucavance, please do not take 48 hours after completing test unless otherwise instructed.   Once we have confirmed authorization from your insurance company, we will call you to set up a date and time for your test. Based on how quickly your insurance processes prior authorizations requests, please allow up to 4 weeks to be contacted for scheduling your Cardiac CT appointment. Be advised that routine Cardiac CT appointments could be scheduled as many as 8 weeks after your provider has ordered it.  For non-scheduling related questions, please contact the cardiac imaging nurse navigator should you have any questions/concerns: Marchia Bond, Cardiac Imaging Nurse Navigator Burley Saver, Interim Cardiac Imaging Nurse  [place] and Vascular Services Direct Office Dial: [phone]   For scheduling needs, including cancellations and rescheduling, please call Vivien Rota at [phone], option 3.        Signed, Addison Naegeli. Audie Box, [place]  [address], [place], [place] [address] [phone]  06/01/2020 8:55 AM

## 2020-05-29 DIAGNOSIS — J3081 Allergic rhinitis due to animal (cat) (dog) hair and dander: Secondary | ICD-10-CM | POA: Diagnosis not present

## 2020-05-29 DIAGNOSIS — J3089 Other allergic rhinitis: Secondary | ICD-10-CM | POA: Diagnosis not present

## 2020-05-29 DIAGNOSIS — J301 Allergic rhinitis due to pollen: Secondary | ICD-10-CM | POA: Diagnosis not present

## 2020-06-01 ENCOUNTER — Ambulatory Visit (INDEPENDENT_AMBULATORY_CARE_PROVIDER_SITE_OTHER): Payer: BC Managed Care – PPO | Admitting: Cardiovascular Disease

## 2020-06-01 ENCOUNTER — Encounter: Payer: Self-pay | Admitting: Cardiovascular Disease

## 2020-06-01 ENCOUNTER — Telehealth: Payer: Self-pay | Admitting: Radiology

## 2020-06-01 ENCOUNTER — Other Ambulatory Visit: Payer: Self-pay

## 2020-06-01 VITALS — BP 122/78 | HR 63 | Temp 97.3°F | Ht 66.0 in | Wt 169.8 lb

## 2020-06-01 DIAGNOSIS — R002 Palpitations: Secondary | ICD-10-CM | POA: Diagnosis not present

## 2020-06-01 DIAGNOSIS — R079 Chest pain, unspecified: Secondary | ICD-10-CM | POA: Diagnosis not present

## 2020-06-01 LAB — TSH: TSH: 2.05 u[IU]/mL (ref 0.450–4.500)

## 2020-06-01 MED ORDER — METOPROLOL TARTRATE 50 MG PO TABS: ORAL_TABLET | ORAL | 0 refills | Status: DC

## 2020-06-01 NOTE — Telephone Encounter (Signed)
Enrolled patient for a 3 day Zio XT monitor to be mailed to patients home  

## 2020-06-01 NOTE — Patient Instructions (Addendum)
Medication Instructions:  Take Metoprolol 50 mg two hours before CT when scheduled.   *If you need a refill on your cardiac medications before your next appointment, please call your pharmacy*   Lab Work: TSH today  If you have labs (blood work) drawn today and your tests are completely normal, you will receive your results only by:  [place] (if you have MyChart) OR  A paper copy in the mail If you have any lab test that is abnormal or we need to change your treatment, we will call you to review the results.   Testing/Procedures: Your physician has requested that you have cardiac CT. Cardiac computed tomography (CT) is a painless test that uses an x-ray machine to take clear, detailed pictures of your heart. For further information please visit [email or web address].[email or web address]. Please follow instruction sheet as given.  Your physician has recommended that you wear a 3 DAY ZIO-PATCH monitor. The Zio patch cardiac monitor continuously records heart rhythm data for up to 14 days, this is for patients being evaluated for multiple types heart rhythms. For the first 24 hours post application, please avoid getting the Zio monitor wet in the shower or by excessive sweating during exercise. After that, feel free to carry on with regular activities. Keep soaps and lotions away from the ZIO XT Patch.  This will be mailed to you, please expect 7-10 days to receive.    Applying the monitor   Shave hair from upper left chest.   Hold abrader disc by orange tab.  Rub abrader in 40 strokes over left upper chest as indicated in your monitor instructions.   Clean area with 4 enclosed alcohol pads .  Use all pads to assure are is cleaned thoroughly.  Let dry.   Apply patch as indicated in monitor instructions.  Patch will be place under collarbone on left side of chest with arrow pointing upward.   Rub patch adhesive wings for 2 minutes.Remove white label marked "1".  Remove white label marked "2".  Rub  patch adhesive wings for 2 additional minutes.   While looking in a mirror, press and release button in center of patch.  A small green light will flash 3-4 times .  This will be your only indicator the monitor has been turned on.     Do not shower for the first 24 hours.  You may shower after the first 24 hours.   Press button if you feel a symptom. You will hear a small click.  Record Date, Time and Symptom in the Patient Log Book.   When you are ready to remove patch, follow instructions on last 2 pages of Patient Log Book.  Stick patch monitor onto last page of Patient Log Book.   Place Patient Log Book in [place] box.  Use locking tab on box and tape box closed securely.  The [employer] has [employer] on it.  Please place in mailbox as soon as possible.  Your physician should have your test results approximately 7 days after the monitor has been mailed back to [facility].   Call [place] at [phone] if you have questions regarding your ZIO XT patch monitor.  Call them immediately if you see an orange light blinking on your monitor.   If your monitor falls off in less than 4 days contact our Monitor department at [phone].  If your monitor becomes loose or falls off after 4 days call [employer] at [phone] for suggestions on securing  your monitor     Follow-Up: At [facility], you and your health needs are our priority.  As part of our continuing mission to provide you with exceptional heart care, we have created designated Provider Care Teams.  These Care Teams include your primary Cardiologist (physician) and Advanced Practice Providers (APPs -  Physician Assistants and Nurse Practitioners) who all work together to provide you with the care you need, when you need it.  We recommend signing up for the patient portal called "MyChart".  Sign up information is provided on this After Visit Summary.  MyChart is used to connect with patients for  Virtual Visits (Telemedicine).  Patients are able to view lab/test results, encounter notes, upcoming appointments, etc.  Non-urgent messages can be sent to your provider as well.   To learn more about what you can do with MyChart, go to [email or web address].    Your next appointment:   3 month(s)  The format for your next appointment:   In Person  Provider:   Eleonore Chiquito, MD   Other Instructions  Your cardiac CT will be scheduled at one of the below locations:   [facility] [address] [place], [place] [address] [phone]  scheduled at [facility], please arrive at the [facility] main entrance of [facility] 30 minutes prior to test start time. Proceed to the [facility] Radiology Department (first floor) to check-in and test prep.  Please follow these instructions carefully (unless otherwise directed):  Hold all erectile dysfunction medications at least 3 days (72 hrs) prior to test.  On the Night Before the Test:  Be sure to Drink plenty of water.  Do not consume any caffeinated/decaffeinated beverages or chocolate 12 hours prior to your test.  Do not take any antihistamines 12 hours prior to your test.  On the Day of the Test:  Drink plenty of water. Do not drink any water within one hour of the test.  Do not eat any food 4 hours prior to the test.  You may take your regular medications prior to the test.   Take metoprolol (Lopressor) two hours prior to test.  HOLD Furosemide/Hydrochlorothiazide morning of the test.       After the Test:  Drink plenty of water.  After receiving IV contrast, you may experience a mild flushed feeling. This is normal.  On occasion, you may experience a mild rash up to 24 hours after the test. This is not dangerous. If this occurs, you can take Benadryl 25 mg and increase your fluid intake.  If you experience trouble breathing, this can be serious. If it is severe call 911 IMMEDIATELY.  If it is mild, please call our office.  If you take any of these medications: Glipizide/Metformin, Avandament, Glucavance, please do not take 48 hours after completing test unless otherwise instructed.   Once we have confirmed authorization from your insurance company, we will call you to set up a date and time for your test. Based on how quickly your insurance processes prior authorizations requests, please allow up to 4 weeks to be contacted for scheduling your Cardiac CT appointment. Be advised that routine Cardiac CT appointments could be scheduled as many as 8 weeks after your provider has ordered it.  For non-scheduling related questions, please contact the cardiac imaging nurse navigator should you have any questions/concerns: Marchia Bond, Cardiac Imaging Nurse Navigator Burley Saver, Interim Cardiac Imaging Nurse [place] and Vascular Services Direct Office Dial: [phone]  For scheduling needs, including cancellations and rescheduling, please call Vivien Rota at [phone], option 3.

## 2020-06-02 DIAGNOSIS — J301 Allergic rhinitis due to pollen: Secondary | ICD-10-CM | POA: Diagnosis not present

## 2020-06-05 ENCOUNTER — Ambulatory Visit (INDEPENDENT_AMBULATORY_CARE_PROVIDER_SITE_OTHER): Payer: BC Managed Care – PPO

## 2020-06-05 DIAGNOSIS — R002 Palpitations: Secondary | ICD-10-CM

## 2020-06-05 DIAGNOSIS — J3081 Allergic rhinitis due to animal (cat) (dog) hair and dander: Secondary | ICD-10-CM | POA: Diagnosis not present

## 2020-06-05 DIAGNOSIS — J3089 Other allergic rhinitis: Secondary | ICD-10-CM | POA: Diagnosis not present

## 2020-06-08 DIAGNOSIS — E782 Mixed hyperlipidemia: Secondary | ICD-10-CM | POA: Diagnosis not present

## 2020-06-08 DIAGNOSIS — I10 Essential (primary) hypertension: Secondary | ICD-10-CM | POA: Diagnosis not present

## 2020-06-08 DIAGNOSIS — Z0000 Encounter for general adult medical examination without abnormal findings: Secondary | ICD-10-CM | POA: Diagnosis not present

## 2020-06-14 DIAGNOSIS — J301 Allergic rhinitis due to pollen: Secondary | ICD-10-CM | POA: Diagnosis not present

## 2020-06-14 DIAGNOSIS — J3089 Other allergic rhinitis: Secondary | ICD-10-CM | POA: Diagnosis not present

## 2020-06-14 DIAGNOSIS — J3081 Allergic rhinitis due to animal (cat) (dog) hair and dander: Secondary | ICD-10-CM | POA: Diagnosis not present

## 2020-06-16 DIAGNOSIS — J301 Allergic rhinitis due to pollen: Secondary | ICD-10-CM | POA: Diagnosis not present

## 2020-06-16 DIAGNOSIS — J3081 Allergic rhinitis due to animal (cat) (dog) hair and dander: Secondary | ICD-10-CM | POA: Diagnosis not present

## 2020-06-16 DIAGNOSIS — J3089 Other allergic rhinitis: Secondary | ICD-10-CM | POA: Diagnosis not present

## 2020-06-20 ENCOUNTER — Telehealth (HOSPITAL_COMMUNITY): Payer: Self-pay | Admitting: *Deleted

## 2020-06-20 DIAGNOSIS — J301 Allergic rhinitis due to pollen: Secondary | ICD-10-CM | POA: Diagnosis not present

## 2020-06-20 DIAGNOSIS — J3089 Other allergic rhinitis: Secondary | ICD-10-CM | POA: Diagnosis not present

## 2020-06-20 DIAGNOSIS — J3081 Allergic rhinitis due to animal (cat) (dog) hair and dander: Secondary | ICD-10-CM | POA: Diagnosis not present

## 2020-06-20 NOTE — Telephone Encounter (Signed)
Reaching out to patient to offer assistance regarding upcoming cardiac imaging study; pt verbalizes understanding of appt date/time, parking situation and where to check in, pre-test NPO status and medications ordered, and verified current allergies; name and call back number provided for further questions should they arise ° °Nicoles Sedlacek Tai RN Navigator Cardiac Imaging °[facility] °[phone] office °[phone] cell ° °

## 2020-06-21 ENCOUNTER — Other Ambulatory Visit: Payer: Self-pay

## 2020-06-21 ENCOUNTER — Ambulatory Visit (HOSPITAL_COMMUNITY)
Admission: RE | Admit: 2020-06-21 | Discharge: 2020-06-21 | Disposition: A | Discharge status: Home / Self Care | Payer: BC Managed Care – PPO | Source: Ambulatory Visit | Attending: Cardiovascular Disease | Admitting: Cardiovascular Disease

## 2020-06-21 DIAGNOSIS — R079 Chest pain, unspecified: Secondary | ICD-10-CM | POA: Diagnosis not present

## 2020-06-21 MED ORDER — IOHEXOL 350 MG/ML SOLN
80.0000 mL | Freq: Once | INTRAVENOUS | Status: AC | PRN
  Administered 2020-06-21: 80 mL via INTRAVENOUS

## 2020-06-21 MED ORDER — NITROGLYCERIN 0.4 MG SL SUBL
0.8000 mg | SUBLINGUAL_TABLET | Freq: Once | SUBLINGUAL | Status: AC
  Administered 2020-06-21: 0.8 mg via SUBLINGUAL

## 2020-06-21 MED ORDER — NITROGLYCERIN 0.4 MG SL SUBL
SUBLINGUAL_TABLET | SUBLINGUAL | Status: AC
  Filled 2020-06-21: qty 2

## 2020-06-23 ENCOUNTER — Other Ambulatory Visit: Payer: Self-pay

## 2020-06-23 DIAGNOSIS — J3089 Other allergic rhinitis: Secondary | ICD-10-CM | POA: Diagnosis not present

## 2020-06-23 DIAGNOSIS — J3081 Allergic rhinitis due to animal (cat) (dog) hair and dander: Secondary | ICD-10-CM | POA: Diagnosis not present

## 2020-06-23 DIAGNOSIS — J301 Allergic rhinitis due to pollen: Secondary | ICD-10-CM | POA: Diagnosis not present

## 2020-06-23 MED ORDER — ATORVASTATIN CALCIUM 40 MG PO TABS: 40.0000 mg | ORAL_TABLET | Freq: Every day | ORAL | 1 refills | Status: DC

## 2020-06-27 DIAGNOSIS — J343 Hypertrophy of nasal turbinates: Secondary | ICD-10-CM | POA: Diagnosis not present

## 2020-06-27 DIAGNOSIS — J310 Chronic rhinitis: Secondary | ICD-10-CM | POA: Diagnosis not present

## 2020-06-27 DIAGNOSIS — J342 Deviated nasal septum: Secondary | ICD-10-CM | POA: Diagnosis not present

## 2020-06-28 DIAGNOSIS — J3089 Other allergic rhinitis: Secondary | ICD-10-CM | POA: Diagnosis not present

## 2020-06-28 DIAGNOSIS — J301 Allergic rhinitis due to pollen: Secondary | ICD-10-CM | POA: Diagnosis not present

## 2020-06-28 DIAGNOSIS — J3081 Allergic rhinitis due to animal (cat) (dog) hair and dander: Secondary | ICD-10-CM | POA: Diagnosis not present

## 2020-06-30 DIAGNOSIS — J3089 Other allergic rhinitis: Secondary | ICD-10-CM | POA: Diagnosis not present

## 2020-06-30 DIAGNOSIS — J3081 Allergic rhinitis due to animal (cat) (dog) hair and dander: Secondary | ICD-10-CM | POA: Diagnosis not present

## 2020-06-30 DIAGNOSIS — J301 Allergic rhinitis due to pollen: Secondary | ICD-10-CM | POA: Diagnosis not present

## 2020-07-03 ENCOUNTER — Other Ambulatory Visit: Payer: Self-pay | Admitting: Otolaryngology

## 2020-07-03 DIAGNOSIS — J329 Chronic sinusitis, unspecified: Secondary | ICD-10-CM

## 2020-07-05 DIAGNOSIS — J301 Allergic rhinitis due to pollen: Secondary | ICD-10-CM | POA: Diagnosis not present

## 2020-07-05 DIAGNOSIS — J3081 Allergic rhinitis due to animal (cat) (dog) hair and dander: Secondary | ICD-10-CM | POA: Diagnosis not present

## 2020-07-05 DIAGNOSIS — J3089 Other allergic rhinitis: Secondary | ICD-10-CM | POA: Diagnosis not present

## 2020-07-07 DIAGNOSIS — J3081 Allergic rhinitis due to animal (cat) (dog) hair and dander: Secondary | ICD-10-CM | POA: Diagnosis not present

## 2020-07-07 DIAGNOSIS — J3089 Other allergic rhinitis: Secondary | ICD-10-CM | POA: Diagnosis not present

## 2020-07-07 DIAGNOSIS — J301 Allergic rhinitis due to pollen: Secondary | ICD-10-CM | POA: Diagnosis not present

## 2020-07-11 DIAGNOSIS — J3089 Other allergic rhinitis: Secondary | ICD-10-CM | POA: Diagnosis not present

## 2020-07-11 DIAGNOSIS — J3081 Allergic rhinitis due to animal (cat) (dog) hair and dander: Secondary | ICD-10-CM | POA: Diagnosis not present

## 2020-07-11 DIAGNOSIS — J301 Allergic rhinitis due to pollen: Secondary | ICD-10-CM | POA: Diagnosis not present

## 2020-07-13 ENCOUNTER — Ambulatory Visit
Admission: RE | Admit: 2020-07-13 | Discharge: 2020-07-13 | Disposition: A | Discharge status: Home / Self Care | Payer: BC Managed Care – PPO | Source: Ambulatory Visit | Attending: Otolaryngology | Admitting: Otolaryngology

## 2020-07-13 DIAGNOSIS — J342 Deviated nasal septum: Secondary | ICD-10-CM | POA: Diagnosis not present

## 2020-07-13 DIAGNOSIS — J329 Chronic sinusitis, unspecified: Secondary | ICD-10-CM | POA: Diagnosis not present

## 2020-07-13 DIAGNOSIS — J3489 Other specified disorders of nose and nasal sinuses: Secondary | ICD-10-CM | POA: Diagnosis not present

## 2020-07-14 DIAGNOSIS — J3081 Allergic rhinitis due to animal (cat) (dog) hair and dander: Secondary | ICD-10-CM | POA: Diagnosis not present

## 2020-07-14 DIAGNOSIS — J3089 Other allergic rhinitis: Secondary | ICD-10-CM | POA: Diagnosis not present

## 2020-07-14 DIAGNOSIS — J301 Allergic rhinitis due to pollen: Secondary | ICD-10-CM | POA: Diagnosis not present

## 2020-07-17 DIAGNOSIS — J3081 Allergic rhinitis due to animal (cat) (dog) hair and dander: Secondary | ICD-10-CM | POA: Diagnosis not present

## 2020-07-17 DIAGNOSIS — J301 Allergic rhinitis due to pollen: Secondary | ICD-10-CM | POA: Diagnosis not present

## 2020-07-17 DIAGNOSIS — J3089 Other allergic rhinitis: Secondary | ICD-10-CM | POA: Diagnosis not present

## 2020-07-19 DIAGNOSIS — J301 Allergic rhinitis due to pollen: Secondary | ICD-10-CM | POA: Diagnosis not present

## 2020-07-19 DIAGNOSIS — J3089 Other allergic rhinitis: Secondary | ICD-10-CM | POA: Diagnosis not present

## 2020-07-19 DIAGNOSIS — J3081 Allergic rhinitis due to animal (cat) (dog) hair and dander: Secondary | ICD-10-CM | POA: Diagnosis not present

## 2020-07-25 DIAGNOSIS — J3081 Allergic rhinitis due to animal (cat) (dog) hair and dander: Secondary | ICD-10-CM | POA: Diagnosis not present

## 2020-07-25 DIAGNOSIS — J3089 Other allergic rhinitis: Secondary | ICD-10-CM | POA: Diagnosis not present

## 2020-07-25 DIAGNOSIS — J301 Allergic rhinitis due to pollen: Secondary | ICD-10-CM | POA: Diagnosis not present

## 2020-07-27 DIAGNOSIS — J3089 Other allergic rhinitis: Secondary | ICD-10-CM | POA: Diagnosis not present

## 2020-07-27 DIAGNOSIS — J343 Hypertrophy of nasal turbinates: Secondary | ICD-10-CM | POA: Diagnosis not present

## 2020-07-27 DIAGNOSIS — J342 Deviated nasal septum: Secondary | ICD-10-CM | POA: Diagnosis not present

## 2020-07-27 DIAGNOSIS — J310 Chronic rhinitis: Secondary | ICD-10-CM | POA: Diagnosis not present

## 2020-07-27 DIAGNOSIS — J3081 Allergic rhinitis due to animal (cat) (dog) hair and dander: Secondary | ICD-10-CM | POA: Diagnosis not present

## 2020-08-01 DIAGNOSIS — J3089 Other allergic rhinitis: Secondary | ICD-10-CM | POA: Diagnosis not present

## 2020-08-01 DIAGNOSIS — J3081 Allergic rhinitis due to animal (cat) (dog) hair and dander: Secondary | ICD-10-CM | POA: Diagnosis not present

## 2020-08-01 DIAGNOSIS — J301 Allergic rhinitis due to pollen: Secondary | ICD-10-CM | POA: Diagnosis not present

## 2020-08-03 DIAGNOSIS — J3081 Allergic rhinitis due to animal (cat) (dog) hair and dander: Secondary | ICD-10-CM | POA: Diagnosis not present

## 2020-08-03 DIAGNOSIS — J3089 Other allergic rhinitis: Secondary | ICD-10-CM | POA: Diagnosis not present

## 2020-08-03 DIAGNOSIS — J301 Allergic rhinitis due to pollen: Secondary | ICD-10-CM | POA: Diagnosis not present

## 2020-08-08 DIAGNOSIS — J301 Allergic rhinitis due to pollen: Secondary | ICD-10-CM | POA: Diagnosis not present

## 2020-08-08 DIAGNOSIS — J3081 Allergic rhinitis due to animal (cat) (dog) hair and dander: Secondary | ICD-10-CM | POA: Diagnosis not present

## 2020-08-08 DIAGNOSIS — J3089 Other allergic rhinitis: Secondary | ICD-10-CM | POA: Diagnosis not present

## 2020-08-10 DIAGNOSIS — J3089 Other allergic rhinitis: Secondary | ICD-10-CM | POA: Diagnosis not present

## 2020-08-10 DIAGNOSIS — J3081 Allergic rhinitis due to animal (cat) (dog) hair and dander: Secondary | ICD-10-CM | POA: Diagnosis not present

## 2020-08-10 DIAGNOSIS — J301 Allergic rhinitis due to pollen: Secondary | ICD-10-CM | POA: Diagnosis not present

## 2020-08-14 DIAGNOSIS — Z01818 Encounter for other preprocedural examination: Secondary | ICD-10-CM | POA: Diagnosis not present

## 2020-08-15 DIAGNOSIS — J301 Allergic rhinitis due to pollen: Secondary | ICD-10-CM | POA: Diagnosis not present

## 2020-08-15 DIAGNOSIS — J3081 Allergic rhinitis due to animal (cat) (dog) hair and dander: Secondary | ICD-10-CM | POA: Diagnosis not present

## 2020-08-15 DIAGNOSIS — J3089 Other allergic rhinitis: Secondary | ICD-10-CM | POA: Diagnosis not present

## 2020-08-17 DIAGNOSIS — J3489 Other specified disorders of nose and nasal sinuses: Secondary | ICD-10-CM | POA: Diagnosis not present

## 2020-08-17 DIAGNOSIS — J343 Hypertrophy of nasal turbinates: Secondary | ICD-10-CM | POA: Diagnosis not present

## 2020-08-17 DIAGNOSIS — J342 Deviated nasal septum: Secondary | ICD-10-CM | POA: Diagnosis not present

## 2020-08-18 DIAGNOSIS — J301 Allergic rhinitis due to pollen: Secondary | ICD-10-CM | POA: Diagnosis not present

## 2020-08-18 DIAGNOSIS — J3089 Other allergic rhinitis: Secondary | ICD-10-CM | POA: Diagnosis not present

## 2020-08-18 DIAGNOSIS — J3081 Allergic rhinitis due to animal (cat) (dog) hair and dander: Secondary | ICD-10-CM | POA: Diagnosis not present

## 2020-08-22 DIAGNOSIS — J301 Allergic rhinitis due to pollen: Secondary | ICD-10-CM | POA: Diagnosis not present

## 2020-08-22 DIAGNOSIS — J3081 Allergic rhinitis due to animal (cat) (dog) hair and dander: Secondary | ICD-10-CM | POA: Diagnosis not present

## 2020-08-22 DIAGNOSIS — J3089 Other allergic rhinitis: Secondary | ICD-10-CM | POA: Diagnosis not present

## 2020-08-28 DIAGNOSIS — J3089 Other allergic rhinitis: Secondary | ICD-10-CM | POA: Diagnosis not present

## 2020-08-28 DIAGNOSIS — J301 Allergic rhinitis due to pollen: Secondary | ICD-10-CM | POA: Diagnosis not present

## 2020-08-28 DIAGNOSIS — J3081 Allergic rhinitis due to animal (cat) (dog) hair and dander: Secondary | ICD-10-CM | POA: Diagnosis not present

## 2020-08-30 DIAGNOSIS — D225 Melanocytic nevi of trunk: Secondary | ICD-10-CM | POA: Diagnosis not present

## 2020-08-30 DIAGNOSIS — L570 Actinic keratosis: Secondary | ICD-10-CM | POA: Diagnosis not present

## 2020-08-30 DIAGNOSIS — L821 Other seborrheic keratosis: Secondary | ICD-10-CM | POA: Diagnosis not present

## 2020-08-30 DIAGNOSIS — X32XXXD Exposure to sunlight, subsequent encounter: Secondary | ICD-10-CM | POA: Diagnosis not present

## 2020-09-03 NOTE — Progress Notes (Signed)
Cardiology Office Note:   Date:  09/04/2020  NAME:  Stephen Ayers    MRN: [id number] DOB:  07/29/1961   PCP:  Shirline Frees, MD  Cardiologist:  No primary care provider on file.   Referring MD: Shirline Frees, MD   Chief Complaint  Patient presents with   Follow-up    3 months.   History of Present Illness:   Stephen Ayers is a 60 y.o. male with a hx of CAD, HTN, HLD who presents for follow-up. Seen in August for CP and palpitations. <1% PAC/PVCs on monitor. CCTA with mild non-obstructive CAD. Needs an echo.  He reports he is doing well.  No further episodes of chest pain or pressure.  He is having palpitations still.  They can occur once per week.  They last seconds.  Described as rapid heartbeat sensation.  His most recent LDL cholesterol 73.  He is on aspirin as well as Lipitor 40 mg a day.  Seems to be doing well.  LDL is basically at goal.  Blood pressures well controlled today in office.  He denies any shortness of breath.  He does need an echocardiogram.  Problem List 1. CAD  -mild CAD (25-49%) in LAD -CAC score 344 (84th percentile) 2. HLD -T chol 151, HDL 47, LDL 82, TG 107 3. HTN 4. PACs/PVCs -<1% burden  Past Medical History: Past Medical History:  Diagnosis Date   Arthritis    oa right hip and shoulder   GERD (gastroesophageal reflux disease)    Headache    from allergies   Hyperlipidemia    Hypertension    Irregular heart beat pac no cardiologist   saw cardiology dr Ellwood Sayers in past and released   Prostate cancer [facility])    Prostate cancer [facility])     Past Surgical History: Past Surgical History:  Procedure Laterality Date   colonscopy     PROSTATE BIOPSY     RADIOACTIVE SEED IMPLANT N/A 01/13/2020   Procedure: RADIOACTIVE SEED IMPLANT/BRACHYTHERAPY IMPLANT;  Surgeon: Irine Seal, MD;  Location: [facility];  Service: Urology;  Laterality: N/A;   SPACE OAR INSTILLATION N/A 01/13/2020   Procedure: SPACE OAR INSTILLATION;   Surgeon: Irine Seal, MD;  Location: [facility];  Service: Urology;  Laterality: N/A;   WISDOM TOOTH EXTRACTION      Current Medications: Current Meds  Medication Sig   acetaminophen (TYLENOL) 325 MG tablet Take 650 mg by mouth every 6 (six) hours as needed.    ASPIRIN 81 PO Take 81 mg by mouth every morning.   atorvastatin (LIPITOR) 40 MG tablet Take 1 tablet (40 mg total) by mouth daily.   Chlorphen-Pseudoephed-APAP (TYLENOL ALLERGY SINUS PO) Take 1 capsule by mouth every 4 (four) hours as needed (allergies).    losartan-hydrochlorothiazide (HYZAAR) 50-12.5 MG per tablet Take 1 tablet by mouth daily.   naproxen (NAPROSYN) 500 MG tablet Take 500 mg by mouth 3 (three) times daily as needed for mild pain.    tamsulosin (FLOMAX) 0.4 MG CAPS capsule Take 0.8 mg by mouth daily.    [DISCONTINUED] fluticasone (FLONASE) 50 MCG/ACT nasal spray Place 2 sprays into both nostrils in the morning.    [DISCONTINUED] metoprolol tartrate (LOPRESSOR) 50 MG tablet Take 1 tablet by mouth once for procedure.     Allergies:    Other and Tree extract   Social History: Social History   Socioeconomic History   Marital status: Married    Spouse name: Not on file  Number of children: Not on file   Years of education: Not on file   Highest education level: Not on file  Occupational History   Occupation: IT  Tobacco Use   Smoking status: Never Smoker   Smokeless tobacco: Never Used  Vaping Use   Vaping Use: Never used  Substance and Sexual Activity   Alcohol use: Yes    Comment: 1 glass wine  nightly   Drug use: No   Sexual activity: Yes  Other Topics Concern   Not on file  Social History Narrative   Not on file   Social Determinants of Health   Financial Resource Strain:    Difficulty of Paying Living Expenses: Not on file  Food Insecurity:    Worried About [place] in the Last Year: Not on file   Ran Out of Food in the Last Year: Not on  file  Transportation Needs:    Lack of Transportation (Medical): Not on file   Lack of Transportation (Non-Medical): Not on file  Physical Activity:    Days of Exercise per Week: Not on file   Minutes of Exercise per Session: Not on file  Stress:    Feeling of Stress : Not on file  Social Connections:    Frequency of Communication with Friends and Family: Not on file   Frequency of Social Gatherings with Friends and Family: Not on file   Attends Religious Services: Not on file   Active Member of Clubs or Organizations: Not on file   Attends Archivist Meetings: Not on file   Marital Status: Not on file     Family History: The patient's family history includes Cancer in his paternal uncle; Heart disease in his father and mother; Kidney failure in his father; Mitral valve prolapse in his mother. There is no history of Breast cancer, Prostate cancer, Colon cancer, or Pancreatic cancer.  ROS:   All other ROS reviewed and negative. Pertinent positives noted in the HPI.     EKGs/Labs/Other Studies Reviewed:   The following studies were personally reviewed by me today:   CCTA 06/21/2020 IMPRESSION: 1.  Mild nonobstructive CAD, CADRADS = 2.  2. Coronary calcium score of 344. This was 84th percentile for age and sex matched control.  3. Normal coronary origin with right dominance.  Recent Labs: 01/10/2020: ALT 32 05/23/2020: BUN 16; Creatinine, Ser 1.10; Hemoglobin 13.9; Platelets 252; Potassium 3.7; Sodium 139 06/01/2020: TSH 2.050   Recent Lipid Panel No results found for: CHOL, TRIG, HDL, CHOLHDL, VLDL, LDLCALC, LDLDIRECT  Physical Exam:   VS:  BP 132/76 (BP Location: Left Arm, Patient Position: Sitting, Cuff Size: Normal)    Pulse 78    Ht 5\' 6"  (1.676 m)    Wt 175 lb (79.4 kg)    BMI 28.25 kg/m    Wt Readings from Last 3 Encounters:  09/04/20 175 lb (79.4 kg)  06/01/20 169 lb 12.8 oz (77 kg)  05/23/20 175 lb 4.3 oz (79.5 kg)    General: Well  nourished, well developed, in no acute distress Heart: Atraumatic, normal size  Eyes: PEERLA, EOMI  Neck: Supple, no JVD Endocrine: No thryomegaly Cardiac: Normal S1, S2; RRR; no murmurs, rubs, or gallops Lungs: Clear to auscultation bilaterally, no wheezing, rhonchi or rales  Abd: Soft, nontender, no hepatomegaly  Ext: No edema, pulses 2+ Musculoskeletal: No deformities, BUE and BLE strength normal and equal Skin: Warm and dry, no rashes   Neuro: Alert and oriented to person, place,  time, and situation, CNII-XII grossly intact, no focal deficits  Psych: Normal mood and affect   ASSESSMENT:   Stephen Ayers is a 59 y.o. male who presents for the following: 1. Coronary artery disease involving native coronary artery of native heart without angina pectoris   2. Mixed hyperlipidemia   3. Palpitations     PLAN:   1. Coronary artery disease involving native coronary artery of native heart without angina pectoris -Mild nonobstructive CAD on recent coronary CTA.  Elevated coronary calcium score.  Symptoms of chest pain are not explained by this.  He will continue aspirin and his Lipitor.  Most recent LDL cholesterol 73.  This is basically at goal.  He will continue to work on diet and exercise.  We will continue with aggressive secondary risk factor modification.  Blood pressures well controlled today.  2. Mixed hyperlipidemia -Continue Lipitor 40 mg daily.  Most recent LDL 73.  3. Palpitations -Symptoms have improved.  Had brief PACs and PVCs on his recent monitor.  Symptoms are occurring every week and last seconds.  No treatment necessary. -He will proceed with an echocardiogram as above.  Disposition: Return in about 1 year (around 09/04/2021).  Medication Adjustments/Labs and Tests Ordered: Current medicines are reviewed at length with the patient today.  Concerns regarding medicines are outlined above.  Orders Placed This Encounter  Procedures   ECHOCARDIOGRAM COMPLETE   No  orders of the defined types were placed in this encounter.   Patient Instructions  Medication Instructions:  NO CHANGES *If you need a refill on your cardiac medications before your next appointment, please call your pharmacy*   Lab Work: NONE If you have labs (blood work) drawn today and your tests are completely normal, you will receive your results only by:  [place] (if you have MyChart) OR  A paper copy in the mail If you have any lab test that is abnormal or we need to change your treatment, we will call you to review the results.   Testing/Procedures: Your physician has requested that you have an echocardiogram. Echocardiography is a painless test that uses sound waves to create images of your heart. It provides your doctor with information about the size and shape of your heart and how well your hearts chambers and valves are working. This procedure takes approximately one hour. There are no restrictions for this procedure.    Follow-Up: At [facility], you and your health needs are our priority.  As part of our continuing mission to provide you with exceptional heart care, we have created designated Provider Care Teams.  These Care Teams include your primary Cardiologist (physician) and Advanced Practice Providers (APPs -  Physician Assistants and Nurse Practitioners) who all work together to provide you with the care you need, when you need it.  We recommend signing up for the patient portal called "MyChart".  Sign up information is provided on this After Visit Summary.  MyChart is used to connect with patients for Virtual Visits (Telemedicine).  Patients are able to view lab/test results, encounter notes, upcoming appointments, etc.  Non-urgent messages can be sent to your provider as well.   To learn more about what you can do with MyChart, go to [email or web address].    Your next appointment:   1 year(s)  The format for your next appointment:   In  Person  Provider:   Eleonore Chiquito, MD   Other Instructions NONE     Time Spent with Patient: I have spent  a total of 25 minutes with patient reviewing hospital notes, telemetry, EKGs, labs and examining the patient as well as establishing an assessment and plan that was discussed with the patient.  > 50% of time was spent in direct patient care.  Signed, Addison Naegeli. Audie Box, [place]  [address] [place], [place] [address] [phone]  09/04/2020 2:24 PM

## 2020-09-04 ENCOUNTER — Encounter: Payer: Self-pay | Admitting: Cardiovascular Disease

## 2020-09-04 ENCOUNTER — Other Ambulatory Visit: Payer: Self-pay

## 2020-09-04 ENCOUNTER — Ambulatory Visit (INDEPENDENT_AMBULATORY_CARE_PROVIDER_SITE_OTHER): Payer: BC Managed Care – PPO | Admitting: Cardiovascular Disease

## 2020-09-04 VITALS — BP 132/76 | HR 78 | Ht 66.0 in | Wt 175.0 lb

## 2020-09-04 DIAGNOSIS — J301 Allergic rhinitis due to pollen: Secondary | ICD-10-CM | POA: Diagnosis not present

## 2020-09-04 DIAGNOSIS — R002 Palpitations: Secondary | ICD-10-CM | POA: Diagnosis not present

## 2020-09-04 DIAGNOSIS — J3081 Allergic rhinitis due to animal (cat) (dog) hair and dander: Secondary | ICD-10-CM | POA: Diagnosis not present

## 2020-09-04 DIAGNOSIS — E782 Mixed hyperlipidemia: Secondary | ICD-10-CM

## 2020-09-04 DIAGNOSIS — I2510 Atherosclerotic heart disease of native coronary artery without angina pectoris: Secondary | ICD-10-CM | POA: Diagnosis not present

## 2020-09-04 DIAGNOSIS — J3089 Other allergic rhinitis: Secondary | ICD-10-CM | POA: Diagnosis not present

## 2020-09-04 NOTE — Patient Instructions (Signed)
Medication Instructions:  NO CHANGES *If you need a refill on your cardiac medications before your next appointment, please call your pharmacy*   Lab Work: NONE If you have labs (blood work) drawn today and your tests are completely normal, you will receive your results only by: [place] MyChart Message (if you have MyChart) OR . A paper copy in the mail If you have any lab test that is abnormal or we need to change your treatment, we will call you to review the results.   Testing/Procedures: Your physician has requested that you have an echocardiogram. Echocardiography is a painless test that uses sound waves to create images of your heart. It provides your doctor with information about the size and shape of your heart and how well your heart's chambers and valves are working. This procedure takes approximately one hour. There are no restrictions for this procedure.    Follow-Up: At [facility], you and your health needs are our priority.  As part of our continuing mission to provide you with exceptional heart care, we have created designated Provider Care Teams.  These Care Teams include your primary Cardiologist (physician) and Advanced Practice Providers (APPs -  Physician Assistants and Nurse Practitioners) who all work together to provide you with the care you need, when you need it.  We recommend signing up for the patient portal called "MyChart".  Sign up information is provided on this After Visit Summary.  MyChart is used to connect with patients for Virtual Visits (Telemedicine).  Patients are able to view lab/test results, encounter notes, upcoming appointments, etc.  Non-urgent messages can be sent to your provider as well.   To learn more about what you can do with MyChart, go to [email or web address].    Your next appointment:   1 year(s)  The format for your next appointment:   In Person  Provider:   Eleonore Chiquito, MD   Other Instructions NONE

## 2020-09-06 ENCOUNTER — Other Ambulatory Visit: Payer: Self-pay

## 2020-09-06 ENCOUNTER — Ambulatory Visit (HOSPITAL_COMMUNITY): Payer: BC Managed Care – PPO | Attending: Internal Medicine

## 2020-09-06 DIAGNOSIS — I2510 Atherosclerotic heart disease of native coronary artery without angina pectoris: Secondary | ICD-10-CM | POA: Insufficient documentation

## 2020-09-06 DIAGNOSIS — R002 Palpitations: Secondary | ICD-10-CM | POA: Diagnosis not present

## 2020-09-06 LAB — ECHOCARDIOGRAM COMPLETE
Area-P 1/2: 3.03 cm2
S' Lateral: 3.3 cm

## 2020-09-11 DIAGNOSIS — J3089 Other allergic rhinitis: Secondary | ICD-10-CM | POA: Diagnosis not present

## 2020-09-11 DIAGNOSIS — J3081 Allergic rhinitis due to animal (cat) (dog) hair and dander: Secondary | ICD-10-CM | POA: Diagnosis not present

## 2020-09-11 DIAGNOSIS — J301 Allergic rhinitis due to pollen: Secondary | ICD-10-CM | POA: Diagnosis not present

## 2020-09-18 DIAGNOSIS — J3089 Other allergic rhinitis: Secondary | ICD-10-CM | POA: Diagnosis not present

## 2020-09-18 DIAGNOSIS — J3081 Allergic rhinitis due to animal (cat) (dog) hair and dander: Secondary | ICD-10-CM | POA: Diagnosis not present

## 2020-09-18 DIAGNOSIS — J301 Allergic rhinitis due to pollen: Secondary | ICD-10-CM | POA: Diagnosis not present

## 2020-09-26 DIAGNOSIS — J3089 Other allergic rhinitis: Secondary | ICD-10-CM | POA: Diagnosis not present

## 2020-09-26 DIAGNOSIS — J3081 Allergic rhinitis due to animal (cat) (dog) hair and dander: Secondary | ICD-10-CM | POA: Diagnosis not present

## 2020-09-26 DIAGNOSIS — J301 Allergic rhinitis due to pollen: Secondary | ICD-10-CM | POA: Diagnosis not present

## 2020-10-05 DIAGNOSIS — J3089 Other allergic rhinitis: Secondary | ICD-10-CM | POA: Diagnosis not present

## 2020-10-05 DIAGNOSIS — J301 Allergic rhinitis due to pollen: Secondary | ICD-10-CM | POA: Diagnosis not present

## 2020-10-05 DIAGNOSIS — J3081 Allergic rhinitis due to animal (cat) (dog) hair and dander: Secondary | ICD-10-CM | POA: Diagnosis not present

## 2020-10-09 DIAGNOSIS — J301 Allergic rhinitis due to pollen: Secondary | ICD-10-CM | POA: Diagnosis not present

## 2020-10-09 DIAGNOSIS — J3081 Allergic rhinitis due to animal (cat) (dog) hair and dander: Secondary | ICD-10-CM | POA: Diagnosis not present

## 2020-10-09 DIAGNOSIS — J3089 Other allergic rhinitis: Secondary | ICD-10-CM | POA: Diagnosis not present

## 2020-10-11 DIAGNOSIS — J301 Allergic rhinitis due to pollen: Secondary | ICD-10-CM | POA: Diagnosis not present

## 2020-10-12 DIAGNOSIS — J3081 Allergic rhinitis due to animal (cat) (dog) hair and dander: Secondary | ICD-10-CM | POA: Diagnosis not present

## 2020-10-12 DIAGNOSIS — J3089 Other allergic rhinitis: Secondary | ICD-10-CM | POA: Diagnosis not present

## 2020-10-17 DIAGNOSIS — J3081 Allergic rhinitis due to animal (cat) (dog) hair and dander: Secondary | ICD-10-CM | POA: Diagnosis not present

## 2020-10-17 DIAGNOSIS — J301 Allergic rhinitis due to pollen: Secondary | ICD-10-CM | POA: Diagnosis not present

## 2020-10-17 DIAGNOSIS — J3089 Other allergic rhinitis: Secondary | ICD-10-CM | POA: Diagnosis not present

## 2020-10-23 DIAGNOSIS — J3089 Other allergic rhinitis: Secondary | ICD-10-CM | POA: Diagnosis not present

## 2020-10-23 DIAGNOSIS — J301 Allergic rhinitis due to pollen: Secondary | ICD-10-CM | POA: Diagnosis not present

## 2020-10-23 DIAGNOSIS — J3081 Allergic rhinitis due to animal (cat) (dog) hair and dander: Secondary | ICD-10-CM | POA: Diagnosis not present

## 2020-10-31 DIAGNOSIS — J3089 Other allergic rhinitis: Secondary | ICD-10-CM | POA: Diagnosis not present

## 2020-10-31 DIAGNOSIS — J301 Allergic rhinitis due to pollen: Secondary | ICD-10-CM | POA: Diagnosis not present

## 2020-11-01 DIAGNOSIS — N401 Enlarged prostate with lower urinary tract symptoms: Secondary | ICD-10-CM | POA: Diagnosis not present

## 2020-11-01 DIAGNOSIS — R350 Frequency of micturition: Secondary | ICD-10-CM | POA: Diagnosis not present

## 2020-11-07 DIAGNOSIS — J3081 Allergic rhinitis due to animal (cat) (dog) hair and dander: Secondary | ICD-10-CM | POA: Diagnosis not present

## 2020-11-07 DIAGNOSIS — J301 Allergic rhinitis due to pollen: Secondary | ICD-10-CM | POA: Diagnosis not present

## 2020-11-07 DIAGNOSIS — J3089 Other allergic rhinitis: Secondary | ICD-10-CM | POA: Diagnosis not present

## 2020-11-08 DIAGNOSIS — Z8546 Personal history of malignant neoplasm of prostate: Secondary | ICD-10-CM | POA: Diagnosis not present

## 2020-11-08 DIAGNOSIS — R350 Frequency of micturition: Secondary | ICD-10-CM | POA: Diagnosis not present

## 2020-11-08 DIAGNOSIS — N401 Enlarged prostate with lower urinary tract symptoms: Secondary | ICD-10-CM | POA: Diagnosis not present

## 2020-11-14 DIAGNOSIS — J3089 Other allergic rhinitis: Secondary | ICD-10-CM | POA: Diagnosis not present

## 2020-11-14 DIAGNOSIS — J301 Allergic rhinitis due to pollen: Secondary | ICD-10-CM | POA: Diagnosis not present

## 2020-11-14 DIAGNOSIS — J3081 Allergic rhinitis due to animal (cat) (dog) hair and dander: Secondary | ICD-10-CM | POA: Diagnosis not present

## 2020-11-21 DIAGNOSIS — J3089 Other allergic rhinitis: Secondary | ICD-10-CM | POA: Diagnosis not present

## 2020-11-21 DIAGNOSIS — J3081 Allergic rhinitis due to animal (cat) (dog) hair and dander: Secondary | ICD-10-CM | POA: Diagnosis not present

## 2020-11-21 DIAGNOSIS — J301 Allergic rhinitis due to pollen: Secondary | ICD-10-CM | POA: Diagnosis not present

## 2020-11-27 DIAGNOSIS — J3089 Other allergic rhinitis: Secondary | ICD-10-CM | POA: Diagnosis not present

## 2020-11-27 DIAGNOSIS — J3081 Allergic rhinitis due to animal (cat) (dog) hair and dander: Secondary | ICD-10-CM | POA: Diagnosis not present

## 2020-11-27 DIAGNOSIS — J301 Allergic rhinitis due to pollen: Secondary | ICD-10-CM | POA: Diagnosis not present

## 2020-12-06 DIAGNOSIS — J3081 Allergic rhinitis due to animal (cat) (dog) hair and dander: Secondary | ICD-10-CM | POA: Diagnosis not present

## 2020-12-06 DIAGNOSIS — J301 Allergic rhinitis due to pollen: Secondary | ICD-10-CM | POA: Diagnosis not present

## 2020-12-06 DIAGNOSIS — J3089 Other allergic rhinitis: Secondary | ICD-10-CM | POA: Diagnosis not present

## 2020-12-11 ENCOUNTER — Other Ambulatory Visit: Payer: Self-pay | Admitting: Cardiovascular Disease

## 2020-12-11 DIAGNOSIS — E782 Mixed hyperlipidemia: Secondary | ICD-10-CM | POA: Diagnosis not present

## 2020-12-11 DIAGNOSIS — I10 Essential (primary) hypertension: Secondary | ICD-10-CM | POA: Diagnosis not present

## 2020-12-11 DIAGNOSIS — N5237 Erectile dysfunction following prostate ablative therapy: Secondary | ICD-10-CM | POA: Diagnosis not present

## 2020-12-11 DIAGNOSIS — C61 Malignant neoplasm of prostate: Secondary | ICD-10-CM | POA: Diagnosis not present

## 2020-12-12 DIAGNOSIS — J3089 Other allergic rhinitis: Secondary | ICD-10-CM | POA: Diagnosis not present

## 2020-12-12 DIAGNOSIS — J3081 Allergic rhinitis due to animal (cat) (dog) hair and dander: Secondary | ICD-10-CM | POA: Diagnosis not present

## 2020-12-12 DIAGNOSIS — J301 Allergic rhinitis due to pollen: Secondary | ICD-10-CM | POA: Diagnosis not present

## 2020-12-19 DIAGNOSIS — J3089 Other allergic rhinitis: Secondary | ICD-10-CM | POA: Diagnosis not present

## 2020-12-19 DIAGNOSIS — J301 Allergic rhinitis due to pollen: Secondary | ICD-10-CM | POA: Diagnosis not present

## 2020-12-19 DIAGNOSIS — J3081 Allergic rhinitis due to animal (cat) (dog) hair and dander: Secondary | ICD-10-CM | POA: Diagnosis not present

## 2020-12-26 DIAGNOSIS — J3081 Allergic rhinitis due to animal (cat) (dog) hair and dander: Secondary | ICD-10-CM | POA: Diagnosis not present

## 2020-12-26 DIAGNOSIS — J301 Allergic rhinitis due to pollen: Secondary | ICD-10-CM | POA: Diagnosis not present

## 2020-12-26 DIAGNOSIS — J3089 Other allergic rhinitis: Secondary | ICD-10-CM | POA: Diagnosis not present

## 2021-01-02 DIAGNOSIS — J3089 Other allergic rhinitis: Secondary | ICD-10-CM | POA: Diagnosis not present

## 2021-01-02 DIAGNOSIS — J3081 Allergic rhinitis due to animal (cat) (dog) hair and dander: Secondary | ICD-10-CM | POA: Diagnosis not present

## 2021-01-02 DIAGNOSIS — J301 Allergic rhinitis due to pollen: Secondary | ICD-10-CM | POA: Diagnosis not present

## 2021-01-05 DIAGNOSIS — M19011 Primary osteoarthritis, right shoulder: Secondary | ICD-10-CM | POA: Diagnosis not present

## 2021-01-09 DIAGNOSIS — J301 Allergic rhinitis due to pollen: Secondary | ICD-10-CM | POA: Diagnosis not present

## 2021-01-09 DIAGNOSIS — J3089 Other allergic rhinitis: Secondary | ICD-10-CM | POA: Diagnosis not present

## 2021-01-09 DIAGNOSIS — J3081 Allergic rhinitis due to animal (cat) (dog) hair and dander: Secondary | ICD-10-CM | POA: Diagnosis not present

## 2021-01-19 DIAGNOSIS — J3081 Allergic rhinitis due to animal (cat) (dog) hair and dander: Secondary | ICD-10-CM | POA: Diagnosis not present

## 2021-01-19 DIAGNOSIS — J301 Allergic rhinitis due to pollen: Secondary | ICD-10-CM | POA: Diagnosis not present

## 2021-01-19 DIAGNOSIS — J3089 Other allergic rhinitis: Secondary | ICD-10-CM | POA: Diagnosis not present

## 2021-01-23 DIAGNOSIS — N401 Enlarged prostate with lower urinary tract symptoms: Secondary | ICD-10-CM | POA: Diagnosis not present

## 2021-01-23 DIAGNOSIS — R3912 Poor urinary stream: Secondary | ICD-10-CM | POA: Diagnosis not present

## 2021-01-23 DIAGNOSIS — N5201 Erectile dysfunction due to arterial insufficiency: Secondary | ICD-10-CM | POA: Diagnosis not present

## 2021-01-23 DIAGNOSIS — R350 Frequency of micturition: Secondary | ICD-10-CM | POA: Diagnosis not present

## 2021-01-25 DIAGNOSIS — J301 Allergic rhinitis due to pollen: Secondary | ICD-10-CM | POA: Diagnosis not present

## 2021-01-25 DIAGNOSIS — J3081 Allergic rhinitis due to animal (cat) (dog) hair and dander: Secondary | ICD-10-CM | POA: Diagnosis not present

## 2021-01-25 DIAGNOSIS — J3089 Other allergic rhinitis: Secondary | ICD-10-CM | POA: Diagnosis not present

## 2021-01-30 ENCOUNTER — Other Ambulatory Visit: Payer: Self-pay | Admitting: Orthopedic Surgery

## 2021-01-30 ENCOUNTER — Telehealth: Payer: Self-pay | Admitting: Cardiovascular Disease

## 2021-01-30 DIAGNOSIS — J3089 Other allergic rhinitis: Secondary | ICD-10-CM | POA: Diagnosis not present

## 2021-01-30 DIAGNOSIS — J3081 Allergic rhinitis due to animal (cat) (dog) hair and dander: Secondary | ICD-10-CM | POA: Diagnosis not present

## 2021-01-30 DIAGNOSIS — Z01811 Encounter for preprocedural respiratory examination: Secondary | ICD-10-CM

## 2021-01-30 DIAGNOSIS — J301 Allergic rhinitis due to pollen: Secondary | ICD-10-CM | POA: Diagnosis not present

## 2021-01-30 NOTE — Telephone Encounter (Signed)
   [facility] Pre-operative Risk Assessment    Patient Name: Stephen Ayers   [facility] STAFF: - Please ensure there is not already an duplicate clearance open for this procedure. - Under Visit Info/Reason for Call, type in Other and utilize the format Clearance MM/DD/YY or Clearance TBD. Do not use dashes or single digits. - If request is for dental extraction, please clarify the # of teeth to be extracted.  Request for surgical clearance:  1. What type of surgery is being performed? Right total shoulder arthroplasty    2. When is this surgery scheduled? 02/22/2021  3. What type of clearance is required (medical clearance vs. Pharmacy clearance to hold med vs. Both)? Both  4. Are there any medications that need to be held prior to surgery and how long? leaving up to cardiology  5. Practice name and name of physician performing surgery? [facility], Dr. Tamera Punt  6. What is the office phone number? [phone]   7.   What is the office fax number? [phone]  8.   Anesthesia type (None, local, MAC, general) ? choice   Selena Zobro 01/30/2021, 11:32 AM  _________________________________________________________________   (provider comments below)

## 2021-01-30 NOTE — Telephone Encounter (Signed)
   Patient Name: Stephen Ayers  DOB: [birth year]  MRN: [id number]   Primary Cardiologist: No primary care provider on file.  Chart reviewed as part of pre-operative protocol coverage. Patient was contacted 01/30/2021 in reference to pre-operative risk assessment for pending surgery as outlined below.  Stephen Ayers was last seen on 09/04/20 by Dr. Audie Box.  Since that day, Stephen Ayers has done well from a cardiac perspective, no anginal symptoms. METS>4.   Regarding ASA therapy, we recommend continuation of ASA throughout the perioperative period.  However, if the surgeon feels that cessation of ASA is required in the perioperative period, it may be stopped 5-7 days prior to surgery with a plan to resume it as soon as felt to be feasible from a surgical standpoint in the post-operative period.  Therefore, based on ACC/AHA guidelines, the patient would be at acceptable risk for the planned procedure without further cardiovascular testing.   The patient was advised that if he develops new symptoms prior to surgery to contact our office to arrange for a follow-up visit, and he verbalized understanding.  I will route this recommendation to the requesting party via Epic fax function and remove from pre-op pool. Please call with questions.  Brenna Friesenhahn Ninfa Meeker, PA-C 01/30/2021, 3:09 PM

## 2021-01-31 DIAGNOSIS — M19011 Primary osteoarthritis, right shoulder: Secondary | ICD-10-CM | POA: Diagnosis not present

## 2021-02-05 DIAGNOSIS — J301 Allergic rhinitis due to pollen: Secondary | ICD-10-CM | POA: Diagnosis not present

## 2021-02-05 DIAGNOSIS — J3089 Other allergic rhinitis: Secondary | ICD-10-CM | POA: Diagnosis not present

## 2021-02-08 ENCOUNTER — Encounter: Payer: Self-pay | Admitting: Radiation Oncology

## 2021-02-08 NOTE — Progress Notes (Signed)
Received via fax a [place] from Dr. Marisa Sprinkles office. Attached last office note and placed in [place], PA-C's inbox to review.

## 2021-02-12 DIAGNOSIS — M25611 Stiffness of right shoulder, not elsewhere classified: Secondary | ICD-10-CM | POA: Diagnosis not present

## 2021-02-12 DIAGNOSIS — M19011 Primary osteoarthritis, right shoulder: Secondary | ICD-10-CM | POA: Diagnosis not present

## 2021-02-13 DIAGNOSIS — J301 Allergic rhinitis due to pollen: Secondary | ICD-10-CM | POA: Diagnosis not present

## 2021-02-13 DIAGNOSIS — J3089 Other allergic rhinitis: Secondary | ICD-10-CM | POA: Diagnosis not present

## 2021-02-13 DIAGNOSIS — J3081 Allergic rhinitis due to animal (cat) (dog) hair and dander: Secondary | ICD-10-CM | POA: Diagnosis not present

## 2021-02-14 NOTE — Progress Notes (Signed)
DUE TO COVID-19 ONLY ONE VISITOR IS ALLOWED TO COME WITH YOU AND STAY IN THE WAITING ROOM ONLY DURING PRE OP AND PROCEDURE DAY OF SURGERY. THE 1 VISITOR  MAY VISIT WITH YOU AFTER SURGERY IN YOUR PRIVATE ROOM DURING VISITING HOURS ONLY!  YOU NEED TO HAVE A COVID 19 TEST ON__5/06/2021 _____ @_______ , THIS TEST MUST BE DONE BEFORE SURGERY,  COVID TESTING SITE [address] [place] [address], IT IS ON THE RIGHT GOING OUT [address] APPROXIMATELY  2 MINUTES PAST [employer] ON THE RIGHT. ONCE YOUR COVID TEST IS COMPLETED,  PLEASE BEGIN THE QUARANTINE INSTRUCTIONS AS OUTLINED IN YOUR HANDOUT.                Stephen Ayers  02/14/2021   Your procedure is scheduled on:  03/01/21/  Report to [facility] Main  Entrance   Report to admitting at     [place] AM     Call this number if you have problems the morning of surgery [phone]    REMEMBER: NO  SOLID FOOD CANDY OR GUM AFTER MIDNIGHT. CLEAR LIQUIDS UNTIL         0645am     . NOTHING BY MOUTH EXCEPT CLEAR LIQUIDS UNTIL     0645am    . PLEASE FINISH ENSURE DRINK PER SURGEON ORDER  WHICH NEEDS TO BE COMPLETED AT  0645am    .      CLEAR LIQUID DIET   Foods Allowed                                                                    Coffee and tea, regular and decaf                            Fruit ices (not with fruit pulp)                                      Iced Popsicles                                    Carbonated beverages, regular and diet                                    Cranberry, grape and apple juices Sports drinks like Gatorade Lightly seasoned clear broth or consume(fat free) Sugar, honey syrup ___________________________________________________________________      BRUSH YOUR TEETH MORNING OF SURGERY AND RINSE YOUR MOUTH OUT, NO CHEWING GUM CANDY OR MINTS.     Take these medicines the morning of surgery with A SIP OF WATER:  None  DO NOT TAKE ANY DIABETIC MEDICATIONS DAY OF YOUR SURGERY                                You may not have any metal on your body including hair pins and  piercings  Do not wear jewelry, make-up, lotions, powders or perfumes, deodorant             Do not wear nail polish on your fingernails.  Do not shave  48 hours prior to surgery.              Men may shave face and neck.   Do not bring valuables to the hospital. [name]  Contacts, dentures or bridgework may not be worn into surgery.  Leave suitcase in the car. After surgery it may be brought to your room.     Patients discharged the day of surgery will not be allowed to drive home. IF YOU ARE HAVING SURGERY AND GOING HOME THE SAME DAY, YOU MUST HAVE AN ADULT TO DRIVE YOU HOME AND BE WITH YOU FOR 24 HOURS. YOU MAY GO HOME BY TAXI OR UBER OR ORTHERWISE, BUT AN ADULT MUST ACCOMPANY YOU HOME AND STAY WITH YOU FOR 24 HOURS.  Name and phone number of your driver:  Special Instructions: N/A              Please read over the following fact sheets you were given: _____________________________________________________________________  [facility] - Preparing for Surgery Before surgery, you can play an important role.  Because skin is not sterile, your skin needs to be as free of germs as possible.  You can reduce the number of germs on your skin by washing with CHG (chlorahexidine gluconate) soap before surgery.  CHG is an antiseptic cleaner which kills germs and bonds with the skin to continue killing germs even after washing. Please DO NOT use if you have an allergy to CHG or antibacterial soaps.  If your skin becomes reddened/irritated stop using the CHG and inform your nurse when you arrive at Short Stay. Do not shave (including legs and underarms) for at least 48 hours prior to the first CHG shower.  You may shave your face/neck. Please follow these instructions carefully:  1.  Shower with CHG Soap the night before surgery and the  morning of  Surgery.  2.  If you choose to wash your hair, wash your hair first as usual with your  normal  shampoo.  3.  After you shampoo, rinse your hair and body thoroughly to remove the  shampoo.                           4.  Use CHG as you would any other liquid soap.  You can apply chg directly  to the skin and wash                       Gently with a scrungie or clean washcloth.  5.  Apply the CHG Soap to your body ONLY FROM THE NECK DOWN.   Do not use on face/ open                           Wound or open sores. Avoid contact with eyes, ears mouth and genitals (private parts).                       Wash face,  Genitals (private parts) with your normal soap.             6.  Wash thoroughly, paying special attention to the area where your surgery  will be performed.  7.  Thoroughly rinse your body with warm water from the neck down.  8.  DO NOT shower/wash with your normal soap after using and rinsing off  the CHG Soap.                9.  Pat yourself dry with a clean towel.            10.  Wear clean pajamas.            11.  Place clean sheets on your bed the night of your first shower and do not  sleep with pets. Day of Surgery : Do not apply any lotions/deodorants the morning of surgery.  Please wear clean clothes to the hospital/surgery center.  FAILURE TO FOLLOW THESE INSTRUCTIONS MAY RESULT IN THE CANCELLATION OF YOUR SURGERY PATIENT SIGNATURE_________________________________  NURSE SIGNATURE__________________________________  ________________________________________________________________________             [facility]- Preparing for Total Shoulder Arthroplasty    Before surgery, you can play an important role. Because skin is not sterile, your skin needs to be as free of germs as possible. You can reduce the number of germs on your skin by using the following products. . Benzoyl Peroxide Gel o Reduces the number of germs present on the skin o Applied twice a day to shoulder area  starting two days before surgery    ==================================================================  Please follow these instructions carefully:  BENZOYL PEROXIDE 5% GEL  Please do not use if you have an allergy to benzoyl peroxide.   If your skin becomes reddened/irritated stop using the benzoyl peroxide.  Starting two days before surgery, apply as follows: 1. Apply benzoyl peroxide in the morning and at night. Apply after taking a shower. If you are not taking a shower clean entire shoulder front, back, and side along with the armpit with a clean wet washcloth.  2. Place a quarter-sized dollop on your shoulder and rub in thoroughly, making sure to cover the front, back, and side of your shoulder, along with the armpit.   2 days before ____ AM   ____ PM              1 day before ____ AM   ____ PM                         3. Do this twice a day for two days.  (Last application is the night before surgery, AFTER using the CHG soap as described below).  4. Do NOT apply benzoyl peroxide gel on the day of surgery.

## 2021-02-15 ENCOUNTER — Other Ambulatory Visit: Payer: Self-pay

## 2021-02-15 ENCOUNTER — Encounter (HOSPITAL_COMMUNITY): Payer: Self-pay

## 2021-02-15 ENCOUNTER — Encounter (HOSPITAL_COMMUNITY)
Admission: RE | Admit: 2021-02-15 | Discharge: 2021-02-15 | Disposition: A | Discharge status: Home / Self Care | Payer: BC Managed Care – PPO | Source: Ambulatory Visit | Attending: Orthopedic Surgery | Admitting: Orthopedic Surgery

## 2021-02-15 DIAGNOSIS — Z01818 Encounter for other preprocedural examination: Secondary | ICD-10-CM | POA: Insufficient documentation

## 2021-02-15 DIAGNOSIS — Z01811 Encounter for preprocedural respiratory examination: Secondary | ICD-10-CM

## 2021-02-15 LAB — URINALYSIS, ROUTINE W REFLEX MICROSCOPIC
Bilirubin Urine: NEGATIVE
Glucose, UA: NEGATIVE mg/dL
Hgb urine dipstick: NEGATIVE
Ketones, ur: NEGATIVE mg/dL
Leukocytes,Ua: NEGATIVE
Nitrite: NEGATIVE
Protein, ur: NEGATIVE mg/dL
Specific Gravity, Urine: 1.015 (ref 1.005–1.030)
pH: 7 (ref 5.0–8.0)

## 2021-02-15 LAB — TYPE AND SCREEN
ABO/RH(D): A POS
Antibody Screen: NEGATIVE

## 2021-02-15 LAB — COMPREHENSIVE METABOLIC PANEL
ALT: 33 U/L (ref 0–44)
AST: 28 U/L (ref 15–41)
Albumin: 4.6 g/dL (ref 3.5–5.0)
Alkaline Phosphatase: 73 U/L (ref 38–126)
Anion gap: 7 (ref 5–15)
BUN: 20 mg/dL (ref 8–23)
CO2: 26 mmol/L (ref 22–32)
Calcium: 9.5 mg/dL (ref 8.9–10.3)
Chloride: 107 mmol/L (ref 98–111)
Creatinine, Ser: 1.13 mg/dL (ref 0.61–1.24)
GFR, Estimated: >60 mL/min (ref >60)
Glucose, Bld: 98 mg/dL (ref 70–99)
Potassium: 3.7 mmol/L (ref 3.5–5.1)
Sodium: 140 mmol/L (ref 135–145)
Total Bilirubin: 1 mg/dL (ref 0.3–1.2)
Total Protein: 7.3 g/dL (ref 6.5–8.1)

## 2021-02-15 LAB — CBC WITH DIFFERENTIAL/PLATELET
Abs Immature Granulocytes: 0.01 10*3/uL (ref 0.00–0.07)
Basophils Absolute: 0 10*3/uL (ref 0.0–0.1)
Basophils Relative: 1 %
Eosinophils Absolute: 0.1 10*3/uL (ref 0.0–0.5)
Eosinophils Relative: 3 %
HCT: 41.4 % (ref 39.0–52.0)
Hemoglobin: 14.3 g/dL (ref 13.0–17.0)
Immature Granulocytes: 0 %
Lymphocytes Relative: 26 %
Lymphs Abs: 1.1 10*3/uL (ref 0.7–4.0)
MCH: 31.6 pg (ref 26.0–34.0)
MCHC: 34.5 g/dL (ref 30.0–36.0)
MCV: 91.6 fL (ref 80.0–100.0)
Monocytes Absolute: 0.5 10*3/uL (ref 0.1–1.0)
Monocytes Relative: 11 %
Neutro Abs: 2.5 10*3/uL (ref 1.7–7.7)
Neutrophils Relative %: 59 %
Platelets: 216 10*3/uL (ref 150–400)
RBC: 4.52 MIL/uL (ref 4.22–5.81)
RDW: 12.5 % (ref 11.5–15.5)
WBC: 4.3 10*3/uL (ref 4.0–10.5)
nRBC: 0 % (ref 0.0–0.2)

## 2021-02-15 LAB — APTT: aPTT: 30 seconds (ref 24–36)

## 2021-02-15 NOTE — Progress Notes (Signed)
Anesthesia Review:  PCP: DR Azalia Bilis  Cardiologist :  DR Eleonore Chiquito 09/04/2020- [place]  Cadence furth, PA ( card) 01/30/21- clearance  06/21/2020- [facility]  Chest x-ray : 05/23/2020  EKG : 06/01/20  Echo : 08/2020  Stress test: Cardiac Cath :  Activity level: can do a flight of stairs without difficulty  Sleep Study/ CPAP : no  Fasting Blood Sugar :      / Checks Blood Sugar -- times a day:   Blood Thinner/ Instructions /Last Dose: ASA / Instructions/ Last Dose :  81 mg aspirin

## 2021-02-19 NOTE — Anesthesia Preprocedure Evaluation (Addendum)
Anesthesia Evaluation  Patient identified by MRN, date of birth, ID band Patient awake    Reviewed: Allergy & Precautions, NPO status , Patient's Chart, lab work & pertinent test results  Airway Mallampati: II  TM Distance: >3 FB Neck ROM: Full    Dental no notable dental hx. (+) Teeth Intact, Dental Advisory Given   Pulmonary neg pulmonary ROS,    Pulmonary exam normal breath sounds clear to auscultation       Cardiovascular hypertension, Pt. on medications Normal cardiovascular exam Rhythm:Regular Rate:Normal  HLD  TTE 2021 Normal EF, valves ok  Holter Monitor 2021 1. Symptoms occurred with PACs/PVCs.  2. Rare ectopy (<1%).  3. No atrial fibrillation or sustained arrhythmias.  Stress Test 2016 negative   Neuro/Psych negative neurological ROS  negative psych ROS   GI/Hepatic negative GI ROS, Neg liver ROS,   Endo/Other  negative endocrine ROS  Renal/GU negative Renal ROS  negative genitourinary   Musculoskeletal negative musculoskeletal ROS (+)   Abdominal   Peds  Hematology negative hematology ROS (+)   Anesthesia Other Findings   Reproductive/Obstetrics                           Anesthesia Physical Anesthesia Plan  ASA: II  Anesthesia Plan: General and Regional   Post-op Pain Management:  Regional for Post-op pain   Induction: Intravenous  PONV Risk Score and Plan: 2 and Midazolam, Dexamethasone and Ondansetron  Airway Management Planned: Oral ETT  Additional Equipment:   Intra-op Plan:   Post-operative Plan: Extubation in OR  Informed Consent: I have reviewed the patients History and Physical, chart, labs and discussed the procedure including the risks, benefits and alternatives for the proposed anesthesia with the patient or authorized representative who has indicated his/her understanding and acceptance.     Dental advisory given  Plan Discussed with:  CRNA  Anesthesia Plan Comments: (Per cardiology note 01/30/21, "Chart reviewed as part of pre-operative protocol coverage. Patient was contacted 01/30/2021 in reference to pre-operative risk assessment for pending surgery as outlined below.  TEJUAN GHOLSON was last seen on 09/04/20 by Dr. Audie Box.  Since that day, Heron Sabins has done well from a cardiac perspective, no anginal symptoms. METS>4.   Regarding ASA therapy, we recommend continuation of ASA throughout the perioperative period.  However, if the surgeon feels that cessation of ASA is required in the perioperative period, it may be stopped 5-7 days prior to surgery with a plan to resume it as soon as felt to be feasible from a surgical standpoint in the post-operative period.  Therefore, based on ACC/AHA guidelines, the patient would be at acceptable risk for the planned procedure without further cardiovascular testing.")       Anesthesia Quick Evaluation

## 2021-02-22 DIAGNOSIS — J3081 Allergic rhinitis due to animal (cat) (dog) hair and dander: Secondary | ICD-10-CM | POA: Diagnosis not present

## 2021-02-22 DIAGNOSIS — J3089 Other allergic rhinitis: Secondary | ICD-10-CM | POA: Diagnosis not present

## 2021-02-22 DIAGNOSIS — J301 Allergic rhinitis due to pollen: Secondary | ICD-10-CM | POA: Diagnosis not present

## 2021-02-26 ENCOUNTER — Other Ambulatory Visit (HOSPITAL_COMMUNITY)
Admission: RE | Admit: 2021-02-26 | Discharge: 2021-02-26 | Disposition: A | Discharge status: Home / Self Care | Payer: BC Managed Care – PPO | Source: Ambulatory Visit | Attending: Orthopedic Surgery | Admitting: Orthopedic Surgery

## 2021-02-26 ENCOUNTER — Other Ambulatory Visit: Payer: Self-pay | Admitting: Orthopedic Surgery

## 2021-02-26 DIAGNOSIS — Z20822 Contact with and (suspected) exposure to covid-19: Secondary | ICD-10-CM | POA: Insufficient documentation

## 2021-02-26 DIAGNOSIS — J301 Allergic rhinitis due to pollen: Secondary | ICD-10-CM | POA: Diagnosis not present

## 2021-02-26 DIAGNOSIS — Z01812 Encounter for preprocedural laboratory examination: Secondary | ICD-10-CM | POA: Insufficient documentation

## 2021-02-26 DIAGNOSIS — M19011 Primary osteoarthritis, right shoulder: Secondary | ICD-10-CM

## 2021-02-26 DIAGNOSIS — J3089 Other allergic rhinitis: Secondary | ICD-10-CM | POA: Diagnosis not present

## 2021-02-26 DIAGNOSIS — J3081 Allergic rhinitis due to animal (cat) (dog) hair and dander: Secondary | ICD-10-CM | POA: Diagnosis not present

## 2021-02-27 LAB — SARS CORONAVIRUS 2 (TAT 6-24 HRS): SARS Coronavirus 2: NEGATIVE

## 2021-02-28 ENCOUNTER — Ambulatory Visit
Admission: RE | Admit: 2021-02-28 | Discharge: 2021-02-28 | Disposition: A | Discharge status: Home / Self Care | Payer: BC Managed Care – PPO | Source: Ambulatory Visit | Attending: Orthopedic Surgery | Admitting: Orthopedic Surgery

## 2021-02-28 DIAGNOSIS — M19011 Primary osteoarthritis, right shoulder: Secondary | ICD-10-CM

## 2021-02-28 DIAGNOSIS — L570 Actinic keratosis: Secondary | ICD-10-CM | POA: Diagnosis not present

## 2021-02-28 DIAGNOSIS — Z01818 Encounter for other preprocedural examination: Secondary | ICD-10-CM | POA: Diagnosis not present

## 2021-02-28 DIAGNOSIS — X32XXXD Exposure to sunlight, subsequent encounter: Secondary | ICD-10-CM | POA: Diagnosis not present

## 2021-03-01 ENCOUNTER — Ambulatory Visit (HOSPITAL_COMMUNITY): Payer: BC Managed Care – PPO | Admitting: Physician Assistant

## 2021-03-01 ENCOUNTER — Encounter (HOSPITAL_COMMUNITY)
Admission: RE | Disposition: A | Discharge status: Home / Self Care | Payer: Self-pay | Source: Home / Self Care | Attending: Orthopedic Surgery

## 2021-03-01 ENCOUNTER — Ambulatory Visit (HOSPITAL_COMMUNITY)
Admission: RE | Admit: 2021-03-01 | Discharge: 2021-03-01 | Disposition: A | Discharge status: Home / Self Care | Payer: BC Managed Care – PPO | Attending: Orthopedic Surgery | Admitting: Orthopedic Surgery

## 2021-03-01 ENCOUNTER — Ambulatory Visit (HOSPITAL_COMMUNITY): Payer: BC Managed Care – PPO

## 2021-03-01 ENCOUNTER — Ambulatory Visit (HOSPITAL_COMMUNITY): Payer: BC Managed Care – PPO | Admitting: Certified Registered"

## 2021-03-01 ENCOUNTER — Encounter (HOSPITAL_COMMUNITY): Payer: Self-pay | Admitting: Orthopedic Surgery

## 2021-03-01 DIAGNOSIS — M19011 Primary osteoarthritis, right shoulder: Secondary | ICD-10-CM | POA: Diagnosis not present

## 2021-03-01 DIAGNOSIS — M85811 Other specified disorders of bone density and structure, right shoulder: Secondary | ICD-10-CM | POA: Insufficient documentation

## 2021-03-01 DIAGNOSIS — Z79899 Other long term (current) drug therapy: Secondary | ICD-10-CM | POA: Diagnosis not present

## 2021-03-01 DIAGNOSIS — I10 Essential (primary) hypertension: Secondary | ICD-10-CM | POA: Diagnosis not present

## 2021-03-01 DIAGNOSIS — G8918 Other acute postprocedural pain: Secondary | ICD-10-CM | POA: Diagnosis not present

## 2021-03-01 DIAGNOSIS — Z471 Aftercare following joint replacement surgery: Secondary | ICD-10-CM | POA: Diagnosis not present

## 2021-03-01 DIAGNOSIS — Z96611 Presence of right artificial shoulder joint: Secondary | ICD-10-CM

## 2021-03-01 DIAGNOSIS — M25711 Osteophyte, right shoulder: Secondary | ICD-10-CM | POA: Insufficient documentation

## 2021-03-01 DIAGNOSIS — C61 Malignant neoplasm of prostate: Secondary | ICD-10-CM | POA: Diagnosis not present

## 2021-03-01 HISTORY — PX: TOTAL SHOULDER ARTHROPLASTY: SHX126

## 2021-03-01 LAB — SURGICAL PCR SCREEN
MRSA, PCR: NEGATIVE
Staphylococcus aureus: NEGATIVE

## 2021-03-01 LAB — ABO/RH: ABO/RH(D): A POS

## 2021-03-01 SURGERY — ARTHROPLASTY, SHOULDER, TOTAL
Anesthesia: Regional | Site: Shoulder | Laterality: Right

## 2021-03-01 MED ORDER — MIDAZOLAM HCL 2 MG/2ML IJ SOLN
INTRAMUSCULAR | Status: DC | PRN
  Administered 2021-03-01: 2 mg via INTRAVENOUS

## 2021-03-01 MED ORDER — LIDOCAINE 2% (20 MG/ML) 5 ML SYRINGE
INTRAMUSCULAR | Status: AC
  Filled 2021-03-01: qty 5

## 2021-03-01 MED ORDER — TIZANIDINE HCL 4 MG PO TABS: 4.0000 mg | ORAL_TABLET | Freq: Three times a day (TID) | ORAL | 1 refills | Status: DC | PRN

## 2021-03-01 MED ORDER — ROCURONIUM BROMIDE 10 MG/ML (PF) SYRINGE
PREFILLED_SYRINGE | INTRAVENOUS | Status: AC
  Filled 2021-03-01: qty 10

## 2021-03-01 MED ORDER — LACTATED RINGERS IV SOLN: INTRAVENOUS | Status: DC

## 2021-03-01 MED ORDER — PROPOFOL 10 MG/ML IV BOLUS
INTRAVENOUS | Status: AC
  Filled 2021-03-01: qty 40

## 2021-03-01 MED ORDER — SODIUM CHLORIDE 0.9 % IR SOLN
Status: DC | PRN
  Administered 2021-03-01: 1000 mL

## 2021-03-01 MED ORDER — PROPOFOL 10 MG/ML IV BOLUS
INTRAVENOUS | Status: DC | PRN
  Administered 2021-03-01: 140 mg via INTRAVENOUS
  Administered 2021-03-01: 30 mg via INTRAVENOUS

## 2021-03-01 MED ORDER — FENTANYL CITRATE (PF) 100 MCG/2ML IJ SOLN
INTRAMUSCULAR | Status: AC
  Filled 2021-03-01: qty 2

## 2021-03-01 MED ORDER — SUGAMMADEX SODIUM 200 MG/2ML IV SOLN
INTRAVENOUS | Status: DC | PRN
  Administered 2021-03-01: 160 mg via INTRAVENOUS

## 2021-03-01 MED ORDER — DEXAMETHASONE SODIUM PHOSPHATE 10 MG/ML IJ SOLN
INTRAMUSCULAR | Status: DC | PRN
  Administered 2021-03-01: 4 mg via INTRAVENOUS

## 2021-03-01 MED ORDER — ONDANSETRON HCL 4 MG/2ML IJ SOLN
INTRAMUSCULAR | Status: DC | PRN
  Administered 2021-03-01: 4 mg via INTRAVENOUS

## 2021-03-01 MED ORDER — BUPIVACAINE HCL (PF) 0.5 % IJ SOLN
INTRAMUSCULAR | Status: DC | PRN
  Administered 2021-03-01: 15 mL via PERINEURAL

## 2021-03-01 MED ORDER — CEFAZOLIN SODIUM-DEXTROSE 2-4 GM/100ML-% IV SOLN
2.0000 g | INTRAVENOUS | Status: AC
  Administered 2021-03-01: 2 g via INTRAVENOUS
  Filled 2021-03-01: qty 100

## 2021-03-01 MED ORDER — CHLORHEXIDINE GLUCONATE 0.12 % MT SOLN
15.0000 mL | Freq: Once | OROMUCOSAL | Status: AC
  Administered 2021-03-01: 15 mL via OROMUCOSAL

## 2021-03-01 MED ORDER — PHENYLEPHRINE HCL-NACL 10-0.9 MG/250ML-% IV SOLN
INTRAVENOUS | Status: DC | PRN
  Administered 2021-03-01: 10 ug/min via INTRAVENOUS

## 2021-03-01 MED ORDER — ACETAMINOPHEN 500 MG PO TABS
1000.0000 mg | ORAL_TABLET | Freq: Once | ORAL | Status: AC
  Administered 2021-03-01: 1000 mg via ORAL
  Filled 2021-03-01: qty 2

## 2021-03-01 MED ORDER — WATER FOR IRRIGATION, STERILE IR SOLN
Status: DC | PRN
  Administered 2021-03-01: 2000 mL

## 2021-03-01 MED ORDER — LIDOCAINE 2% (20 MG/ML) 5 ML SYRINGE
INTRAMUSCULAR | Status: DC | PRN
  Administered 2021-03-01: 60 mg via INTRAVENOUS

## 2021-03-01 MED ORDER — FENTANYL CITRATE (PF) 250 MCG/5ML IJ SOLN
INTRAMUSCULAR | Status: AC
  Filled 2021-03-01: qty 5

## 2021-03-01 MED ORDER — DEXAMETHASONE SODIUM PHOSPHATE 10 MG/ML IJ SOLN
INTRAMUSCULAR | Status: AC
  Filled 2021-03-01: qty 1

## 2021-03-01 MED ORDER — PHENYLEPHRINE HCL (PRESSORS) 10 MG/ML IV SOLN
INTRAVENOUS | Status: AC
  Filled 2021-03-01: qty 1

## 2021-03-01 MED ORDER — ROCURONIUM BROMIDE 10 MG/ML (PF) SYRINGE
PREFILLED_SYRINGE | INTRAVENOUS | Status: DC | PRN
  Administered 2021-03-01: 70 mg via INTRAVENOUS

## 2021-03-01 MED ORDER — MIDAZOLAM HCL 2 MG/2ML IJ SOLN
INTRAMUSCULAR | Status: AC
  Filled 2021-03-01: qty 2

## 2021-03-01 MED ORDER — FENTANYL CITRATE (PF) 100 MCG/2ML IJ SOLN
25.0000 ug | INTRAMUSCULAR | Status: DC | PRN
  Administered 2021-03-01: 50 ug via INTRAVENOUS

## 2021-03-01 MED ORDER — ONDANSETRON HCL 4 MG/2ML IJ SOLN
INTRAMUSCULAR | Status: AC
  Filled 2021-03-01: qty 2

## 2021-03-01 MED ORDER — BUPIVACAINE LIPOSOME 1.3 % IJ SUSP
INTRAMUSCULAR | Status: DC | PRN
  Administered 2021-03-01: 10 mL via PERINEURAL

## 2021-03-01 MED ORDER — TRANEXAMIC ACID-NACL 1000-0.7 MG/100ML-% IV SOLN
1000.0000 mg | INTRAVENOUS | Status: AC
  Administered 2021-03-01: 1000 mg via INTRAVENOUS
  Filled 2021-03-01: qty 100

## 2021-03-01 MED ORDER — OXYCODONE-ACETAMINOPHEN 5-325 MG PO TABS: ORAL_TABLET | ORAL | 0 refills | Status: DC

## 2021-03-01 MED ORDER — FENTANYL CITRATE (PF) 250 MCG/5ML IJ SOLN
INTRAMUSCULAR | Status: DC | PRN
  Administered 2021-03-01 (×2): 50 ug via INTRAVENOUS

## 2021-03-01 SURGICAL SUPPLY — 61 items
BAG ZIPLOCK 12X15 (MISCELLANEOUS) ×2 IMPLANT
BIT DRILL 1.6MX128 (BIT) ×2 IMPLANT
BLADE SAW SAG 73X25 THK (BLADE) ×1
BLADE SAW SGTL 73X25 THK (BLADE) ×1 IMPLANT
CEMENT BONE DEPUY (Cement) ×2 IMPLANT
COOLER ICEMAN CLASSIC (MISCELLANEOUS) IMPLANT
COVER BACK TABLE 60X90IN (DRAPES) ×2 IMPLANT
COVER SURGICAL LIGHT HANDLE (MISCELLANEOUS) ×2 IMPLANT
COVER WAND RF STERILE (DRAPES) IMPLANT
DRAPE INCISE IOBAN 66X45 STRL (DRAPES) ×2 IMPLANT
DRAPE ORTHO SPLIT 77X108 STRL (DRAPES) ×4
DRAPE POUCH INSTRU U-SHP 10X18 (DRAPES) ×2 IMPLANT
DRAPE SURG 17X11 SM STRL (DRAPES) ×2 IMPLANT
DRAPE SURG ORHT 6 SPLT 77X108 (DRAPES) ×2 IMPLANT
DRAPE TOP 10253 STERILE (DRAPES) ×2 IMPLANT
DRAPE U-SHAPE 47X51 STRL (DRAPES) ×2 IMPLANT
DRSG AQUACEL AG ADV 3.5X 6 (GAUZE/BANDAGES/DRESSINGS) ×2 IMPLANT
DURAPREP 26ML APPLICATOR (WOUND CARE) ×4 IMPLANT
ELECT BLADE TIP CTD 4 INCH (ELECTRODE) ×2 IMPLANT
ELECT REM PT RETURN 15FT ADLT (MISCELLANEOUS) ×2 IMPLANT
GLENOID AUGMENT CORT LG RT 25 (Shoulder) ×2 IMPLANT
GLOVE SRG 8 PF TXTR STRL LF DI (GLOVE) ×1 IMPLANT
GLOVE SURG ENC MOIS LTX SZ7 (GLOVE) ×2 IMPLANT
GLOVE SURG ENC MOIS LTX SZ7.5 (GLOVE) ×2 IMPLANT
GLOVE SURG UNDER POLY LF SZ7 (GLOVE) ×2 IMPLANT
GLOVE SURG UNDER POLY LF SZ8 (GLOVE) ×1
GOWN STRL REUS W/TWL LRG LVL3 (GOWN DISPOSABLE) ×2 IMPLANT
GOWN STRL REUS W/TWL XL LVL3 (GOWN DISPOSABLE) ×2 IMPLANT
GUIDEWIRE GLENOID 2.5X220 (WIRE) ×2 IMPLANT
HANDPIECE INTERPULSE COAX TIP (DISPOSABLE) ×1
HEAD HUM AEQUALIS 52X19 (Head) ×2 IMPLANT
HEMOSTAT SURGICEL 2X14 (HEMOSTASIS) ×2 IMPLANT
HOOD PEEL AWAY FLYTE STAYCOOL (MISCELLANEOUS) ×6 IMPLANT
KIT BASIN OR (CUSTOM PROCEDURE TRAY) ×2 IMPLANT
KIT TURNOVER KIT A (KITS) ×2 IMPLANT
MANIFOLD NEPTUNE II (INSTRUMENTS) ×2 IMPLANT
NEEDLE TROCAR POINT SZ 2 1/2 (NEEDLE) ×2 IMPLANT
NS IRRIG 1000ML POUR BTL (IV SOLUTION) ×2 IMPLANT
PACK SHOULDER (CUSTOM PROCEDURE TRAY) ×2 IMPLANT
PAD COLD SHLDR WRAP-ON (PAD) IMPLANT
PROTECTOR NERVE ULNAR (MISCELLANEOUS) IMPLANT
RESTRAINT HEAD UNIVERSAL NS (MISCELLANEOUS) ×2 IMPLANT
RETRIEVER SUT HEWSON (MISCELLANEOUS) ×2 IMPLANT
SET HNDPC FAN SPRY TIP SCT (DISPOSABLE) ×1 IMPLANT
SLING ARM IMMOBILIZER LRG (SOFTGOODS) IMPLANT
SMARTMIX MINI TOWER (MISCELLANEOUS) ×2
SPONGE LAP 18X18 RF (DISPOSABLE) ×2 IMPLANT
STEM HUMERAL AEQUALIS 70XS2C (Stem) ×2 IMPLANT
STRIP CLOSURE SKIN 1/2X4 (GAUZE/BANDAGES/DRESSINGS) ×2 IMPLANT
SUCTION FRAZIER HANDLE 12FR (TUBING) ×1
SUCTION TUBE FRAZIER 12FR DISP (TUBING) ×1 IMPLANT
SUPPORT WRAP ARM LG (MISCELLANEOUS) ×2 IMPLANT
SUT ETHIBOND 2 V 37 (SUTURE) ×2 IMPLANT
SUT MNCRL AB 4-0 PS2 18 (SUTURE) ×2 IMPLANT
SUT VIC AB 2-0 CT1 27 (SUTURE) ×2
SUT VIC AB 2-0 CT1 TAPERPNT 27 (SUTURE) ×1 IMPLANT
TAPE LABRALWHITE 1.5X36 (TAPE) ×2 IMPLANT
TAPE SUT LABRALTAP WHT/BLK (SUTURE) ×2 IMPLANT
TOWEL OR 17X26 10 PK STRL BLUE (TOWEL DISPOSABLE) ×2 IMPLANT
TOWER SMARTMIX MINI (MISCELLANEOUS) ×1 IMPLANT
WATER STERILE IRR 1000ML POUR (IV SOLUTION) ×2 IMPLANT

## 2021-03-01 NOTE — H&P (Signed)
Stephen Ayers is an 60 y.o. male.   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  Pain interferes with sleep and quality of life.   Past Medical History:  Diagnosis Date  . Arthritis    oa right hip and shoulder  . Hyperlipidemia   . Hypertension   . Irregular heart beat pac no cardiologist   saw cardiology dr Ellwood Sayers in past and released  . Prostate cancer ([place])   . Prostate cancer [facility])     Past Surgical History:  Procedure Laterality Date  . colonscopy    . PROSTATE BIOPSY    . RADIOACTIVE SEED IMPLANT N/A 01/13/2020   Procedure: RADIOACTIVE SEED IMPLANT/BRACHYTHERAPY IMPLANT;  Surgeon: Irine Seal, MD;  Location: [facility];  Service: Urology;  Laterality: N/A;  . SPACE OAR INSTILLATION N/A 01/13/2020   Procedure: SPACE OAR INSTILLATION;  Surgeon: Irine Seal, MD;  Location: [facility] [place];  Service: Urology;  Laterality: N/A;  . WISDOM TOOTH EXTRACTION      Family History  Problem Relation Age of Onset  . Kidney failure Father   . Heart disease Father   . Mitral valve prolapse Mother   . Heart disease Mother   . Cancer Paternal Uncle        unknown  . Breast cancer Neg Hx   . Prostate cancer Neg Hx   . Colon cancer Neg Hx   . Pancreatic cancer Neg Hx    Social History:  reports that he has never smoked. He has never used smokeless tobacco. He reports current alcohol use. He reports that he does not use drugs.  Allergies:  Allergies  Allergen Reactions  . Other Itching and Other (See Comments)    Cat dander- Wheezing, sneezing, and itchy eyes/nose  . Tree Extract Itching and Other (See Comments)    Wheezing, sneezing, and itchy eyes/nose    Medications Prior to Admission  Medication Sig Dispense Refill  . atorvastatin (LIPITOR) 40 MG tablet TAKE 1 TABLET(40 MG) BY MOUTH DAILY (Patient taking differently: Take 40 mg by mouth  daily.) 90 tablet 3  . Chlorphen-Pseudoephed-APAP (TYLENOL ALLERGY SINUS PO) Take 1 capsule by mouth every 4 (four) hours as needed (allergies).     . losartan-hydrochlorothiazide (HYZAAR) 50-12.5 MG per tablet Take 1 tablet by mouth daily.    . Multiple Vitamins-Minerals (CENTRUM SILVER 50+MEN PO) Take 1 tablet by mouth daily.    . naproxen (NAPROSYN) 500 MG tablet Take 500 mg by mouth 3 (three) times daily as needed for moderate pain.    . tamsulosin (FLOMAX) 0.4 MG CAPS capsule Take 0.4 mg by mouth daily.      Results for orders placed or performed during the hospital encounter of 03/01/21 (from the past 48 hour(s))  ABO/Rh     Status: None   Collection Time: 03/01/21  5:46 AM  Result Value Ref Range   ABO/RH(D)      A POS Performed at [facility], [place] [address], [place], [place] [address]    No results found.  Review of Systems  All other systems reviewed and are negative.   Blood pressure 136/80, pulse 82, temperature 98.4 F (36.9 C), temperature source Oral, resp. rate 18, height 5\' 6"  (1.676 m), weight 77.6 kg, SpO2 97 %. Physical Exam Constitutional:      Appearance: He is well-developed.  HENT:     Head: Atraumatic.  Pulmonary:  Effort: Pulmonary effort is normal.  Musculoskeletal:     Comments: R shoulder pain with limited ROM. NVID.  Skin:    General: Skin is warm and dry.  Neurological:     Mental Status: He is alert and oriented to person, place, and time.      Assessment/Plan R shoulder endstage osteoarthritis failed conservative management Plan R TSA Risks / benefits of surgery discussed Consent on chart  NPO for OR Preop antibiotics   Isabella Stalling, MD 03/01/2021, 7:13 AM

## 2021-03-01 NOTE — Anesthesia Procedure Notes (Signed)
Procedure Name: Intubation Date/Time: 03/01/2021 7:33 AM Performed by: Eben Burow, CRNA Pre-anesthesia Checklist: Patient identified, Emergency Drugs available, Suction available, Patient being monitored and Timeout performed Patient Re-evaluated:Patient Re-evaluated prior to induction Oxygen Delivery Method: Circle system utilized Preoxygenation: Pre-oxygenation with 100% oxygen Induction Type: IV induction Ventilation: Mask ventilation without difficulty Laryngoscope Size: Mac and 4 Grade View: Grade II Tube type: Oral Tube size: 7.5 mm Number of attempts: 1 Airway Equipment and Method: Stylet Placement Confirmation: ETT inserted through vocal cords under direct vision,  positive ETCO2 and breath sounds checked- equal and bilateral Secured at: 23 cm Tube secured with: Tape Dental Injury: Teeth and Oropharynx as per pre-operative assessment

## 2021-03-01 NOTE — Discharge Instructions (Signed)
Discharge Instructions after Total Shoulder Arthroplasty   . A sling has been provided for you. Remove the sling 5 times each day to perform motion exercises. . Use ice on the shoulder intermittently over the first 48 hours after surgery.  . Pain medication has been prescribed for you.  . Use your medication liberally over the first 48 hours, and then begin to taper your use. You may take Extra Strength Tylenol or Tylenol only in place of the pain pills. DO NOT take ANY nonsteroidal anti-inflammatory pain medications: Advil, Motrin, Ibuprofen, Aleve, Naproxen, or Naprosyn. . Take one aspirin a day for 2 weeks after surgery, unless you have an aspirin sensitivity/allergy or asthma. . Leave your dressing on until your first follow up visit.  You may shower with the dressing.  Hold your arm as if you still have your sling on while you shower. . Active reaching and lifting are not permitted. You may use the operative arm for activities of daily living that do not require the operative arm to leave the side of the body, such as eating, drinking, bathing, etc.  . Three to 5 times each day you should perform assisted overhead reaching and external rotation (outward turning) exercises with the operative arm. You were taught these exercises prior to discharge. Both exercises should be done with the non-operative arm used as the "therapist arm" while the operative arm remains relaxed. Ten of each exercise should be done three to five times each day.   Overhead reach is helping to lift your stiff arm up as high as it will go. To stretch your overhead reach, lie flat on your back, relax, and grasp the wrist of the tight shoulder with your opposite hand. Using the power in your opposite arm, bring the stiff arm up as far as it is comfortable. Start holding it for ten seconds and then work up to where you can hold it for a count of 30. Breathe slowly and deeply while the arm is moved. Repeat this stretch ten times,  trying to help the ar up a little higher each time.     External rotation is turning the arm out to the side while your elbow stays close to your body. External rotation is best stretched while you are lying on your back. Hold a cane, yardstick, broom handle, or dowel in both hands. Bend both elbows to a right angle. Use steady, gentle force from your normal arm to rotate the hand of the stiff shoulder out away from your body. Continue the rotation until it is straight in front of you holding it there for a count of 10. Do not go beyond this level of rotation until seen back by Dr. Chandler. Repeat this exercise ten times slowly.      Please call [phone] during normal business hours or [phone] after hours for any problems. Including the following:  - excessive redness of the incisions - drainage for more than 4 days - fever of more than 101.5 F  *Please note that pain medications will not be refilled after hours or on weekends.  Dental Antibiotics:  In most cases prophylactic antibiotics for Dental procdeures after total joint surgery are not necessary.  Exceptions are as follows:  1. History of prior total joint infection  2. Severely immunocompromised (Organ Transplant, cancer chemotherapy, Rheumatoid biologic meds such as Humera)  3. Poorly controlled diabetes (A1C &gt; 8.0, blood glucose over 200)  If you have one of these conditions, contact your surgeon for   an antibiotic prescription, prior to your dental procedure.    

## 2021-03-01 NOTE — Op Note (Signed)
Procedure(s): TOTAL SHOULDER ARTHROPLASTY Procedure Note  Stephen Ayers male 60 y.o. 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 male  With endstage right shoulder arthritis. Pain and dysfunction interfered with quality of life and nonoperative treatment with activity modification, NSAIDS and injections failed.     Surgeon: Isabella Stalling   Assistants: Jeanmarie Hubert PA-C [facility] was present and scrubbed throughout the procedure and was essential in positioning, retraction, exposure, and closure)  Anesthesia: General endotracheal anesthesia with preoperative interscalene block given by the attending anesthesiologist   Procedure Detail  TOTAL SHOULDER ARTHROPLASTY  Findings: Tornier flex anatomic press-fit size 2 stem with a 52 x 19 high offset head, cemented size large 25 degree augmented performed plus Cortiloc glenoid.   A lesser tuberosity osteotomy was perform and repaired at the conclusion of the procedure.  Estimated Blood Loss:  200 mL         Drains: None   Blood Given: none          Specimens: none        Complications:  * No complications entered in OR log *         Disposition: PACU - hemodynamically stable.         Condition: stable    Procedure:   The patient was identified in the preoperative holding area where I personally marked the operative extremity after verifying with the patient and consent. He  was taken to the operating room where He was transferred to the   operative table.  The patient received an interscalene block in   the holding area by the attending anesthesiologist.  General anesthesia was induced   in the operating room without  complication.  The patient did receive IV  Ancef prior to the commencement of the procedure.  The patient was   placed in the beach-chair position with the back raised about 30   degrees.  The nonoperative extremity and head and neck were carefully   positioned and padded protecting against neurovascular compromise.  The   left upper extremity was then prepped and draped in the standard sterile   fashion.    The appropriate operative time-out was performed with   Anesthesia, the perioperative staff, as well as myself and we all agreed   that the right side was the correct operative site.  An approximately   10 cm incision was made from the tip of the coracoid to the center point of the   humerus at the level of the axilla.  Dissection was carried down sharply   through subcutaneous tissues and cephalic vein was identified and taken   laterally with the deltoid.  The pectoralis major was taken medially.  The   upper 1 cm of the pectoralis major was released from its attachment on   the humerus.  The clavipectoral fascia was incised just lateral to the   conjoined tendon.  This incision was carried up to but not into the   coracoacromial ligament.  Digital palpation was used to prove   integrity of the axillary nerve which was protected throughout the   procedure.  Musculocutaneous nerve was not palpated in the operative   field.  Conjoined tendon was then retracted gently medially and  the   deltoid laterally.  Anterior circumflex humeral vessels were clamped and   coagulated.  The soft tissues overlying the biceps was incised and this   incision was carried across the transverse humeral ligament to the base   of the coracoid.  The biceps was noted to be severely degenerated. It was released from the superior labrum. The biceps was then tenodesed to the soft tissue just above   pectoralis major and the remaining portion of the biceps superiorly was   excised.  An osteotomy was performed at  the lesser tuberosity.  The capsule was then   released all the way down to the 6 o'clock position of the humeral head.   The humeral head was then delivered with simultaneous adduction,   extension and external rotation.  All humeral osteophytes were removed   and the anatomic neck of the humerus was marked and cut free hand at   approximately 25 degrees retroversion within about 3 mm of the cuff   reflection posteriorly.  The head size was estimated to be a 52 medium   offset.  At that point, the humeral head was retracted posteriorly with   a Fukuda retractor.   Remaining portion of the capsule was released at the base of the   coracoid.  The remaining biceps anchor and the entire anterior-inferior   labrum was excised.  The posterior labrum was also excised but the   posterior capsule was not released.  The glenoid was noted to be B2 morphology.  the guidepin was placed bicortically with perform plus anterior glenoid referencing guide.  The reamer was used to ream to the halfway point anteriorly.  The anterior reference hole was then drilled.  The posterior angled reamer was then used starting at 15 degrees advancing to 25 until the posterior half of the glenoid was appropriately reamed.  The checker was used to ensure appropriate concentric fit.  The peripheral holes were then drilled followed by the the center hole and none of the holes   exited the glenoid wall.  The trial was placed and felt to be an appropriate fit.  I then pulse irrigated these holes and dried   them with Surgicel.  The three peripheral holes were then   pressurized cemented and the anchor peg glenoid was placed and impacted   with an excellent fit.  The glenoid was a large 25 degree perform plus component.  The proximal humerus was then again exposed taking care not to displace the glenoid.    The entry awl was used followed by sounding reamers and then sequentially broached from size 1 to 2. This was then left in place  and the calcar planer was used. Trial head was placed with a 52 x 19.  With the trial implantation of the component,  there was approximately 50% posterior translation with immediate snap back to the   anatomic position.  With forward elevation, there was no tendency   towards posterior subluxation.   The trial was removed and the final implant was prepared on a back table.  The trial was removed and the final implant was prepared on a back table.   3 small holes were drilled on the medial side of the lesser tuberosity osteotomy, through which 2 labral tapes were passed. The implant was then placed through the loop of the 2 labral tapes and impacted with an excellent press-fit. This achieved excellent anatomic reconstruction of the proximal humerus.  The joint was then copiously  irrigated with pulse lavage.  The subscapularis and   lesser tuberosity osteotomy were then repaired using the 2 labral tapes previously passed in a double row fashion with horizontal mattress sutures medially brought over through bone tunnels tied over a bone bridge laterally.   One #1 Ethibond was placed at the rotator interval just above   the lesser tuberosity. Copious irrigation was used. Skin was closed with 2-0 Vicryl sutures in the deep dermal layer and 4-0 Monocryl in a subcuticular  running fashion.  Sterile dressings were then applied including Aquacel.  The patient was placed in a sling and allowed to awaken from general anesthesia and taken to the recovery room in stable condition.      POSTOPERATIVE PLAN:  Early passive range of motion will be allowed with the goal of 0 degrees external rotation and 90 degrees forward elevation.  No internal rotation at this time.  No active motion of the arm until the lesser tuberosity heals.  The patient will be observed in the recovery room and if his pain is well controlled with the regional block and he is hemodynamically stable he will be discharged home today with his wife.

## 2021-03-01 NOTE — Transfer of Care (Signed)
Immediate Anesthesia Transfer of Care Note  Patient: Stephen Ayers  Procedure(s) Performed: TOTAL SHOULDER ARTHROPLASTY (Right Shoulder)  Patient Location: PACU  Anesthesia Type:General  Level of Consciousness: awake, alert  and patient cooperative  Airway & Oxygen Therapy: Patient Spontanous Breathing and Patient connected to face mask oxygen  Post-op Assessment: Report given to RN and Post -op Vital signs reviewed and stable  Post vital signs: Reviewed and stable  Last Vitals:  Vitals Value Taken Time  BP 127/70 03/01/21 0945  Temp    Pulse 75 03/01/21 0947  Resp    SpO2 99 % 03/01/21 0947  Vitals shown include unvalidated device data.  Last Pain:  Vitals:   03/01/21 0603  TempSrc: Oral  PainSc:          Complications: No complications documented.

## 2021-03-01 NOTE — Anesthesia Procedure Notes (Signed)
Anesthesia Regional Block: Interscalene brachial plexus block   Pre-Anesthetic Checklist: ,, timeout performed, Correct Patient, Correct Site, Correct Laterality, Correct Procedure, Correct Position, site marked, Risks and benefits discussed,  Surgical consent,  Pre-op evaluation,  At surgeon's request and post-op pain management  Laterality: Right  Prep: Maximum Sterile Barrier Precautions used, chloraprep       Needles:  Injection technique: Single-shot  Needle Type: Echogenic Stimulator Needle     Needle Length: 4cm  Needle Gauge: 22     Additional Needles:   Procedures:,,,, ultrasound used (permanent image in chart),,,,  Narrative:  Start time: 03/01/2021 7:00 AM End time: 03/01/2021 7:08 AM Injection made incrementally with aspirations every 5 mL.  Performed by: Personally  Anesthesiologist: Freddrick March, MD  Additional Notes: Monitors applied. No increased pain on injection. No increased resistance to injection. Injection made in 5cc increments. Good needle visualization. Patient tolerated procedure well.

## 2021-03-02 NOTE — Anesthesia Postprocedure Evaluation (Signed)
Anesthesia Post Note  Patient: MARVIE CALENDER  Procedure(s) Performed: TOTAL SHOULDER ARTHROPLASTY (Right Shoulder)     Patient location during evaluation: PACU Anesthesia Type: Regional and General Level of consciousness: awake and alert Pain management: pain level controlled Vital Signs Assessment: post-procedure vital signs reviewed and stable Respiratory status: spontaneous breathing, nonlabored ventilation, respiratory function stable and patient connected to nasal cannula oxygen Cardiovascular status: blood pressure returned to baseline and stable Postop Assessment: no apparent nausea or vomiting Anesthetic complications: no   No complications documented.  Last Vitals:  Vitals:   03/01/21 1045 03/01/21 1100  BP: 125/80 127/76  Pulse: 71 70  Resp: 15 19  Temp: 36.5 C   SpO2: 95% 95%    Last Pain:  Vitals:   03/01/21 1100  TempSrc:   PainSc: 2                  Cleotha Whalin L Briannia Laba

## 2021-03-04 IMAGING — DX DG CHEST 2V
2 series · 2 of 2 positions shown · non-contrast
Comparison: December 28, 2009

CLINICAL DATA: Neck carcinoma

EXAM:
CHEST - 2 VIEW

[chest pa]
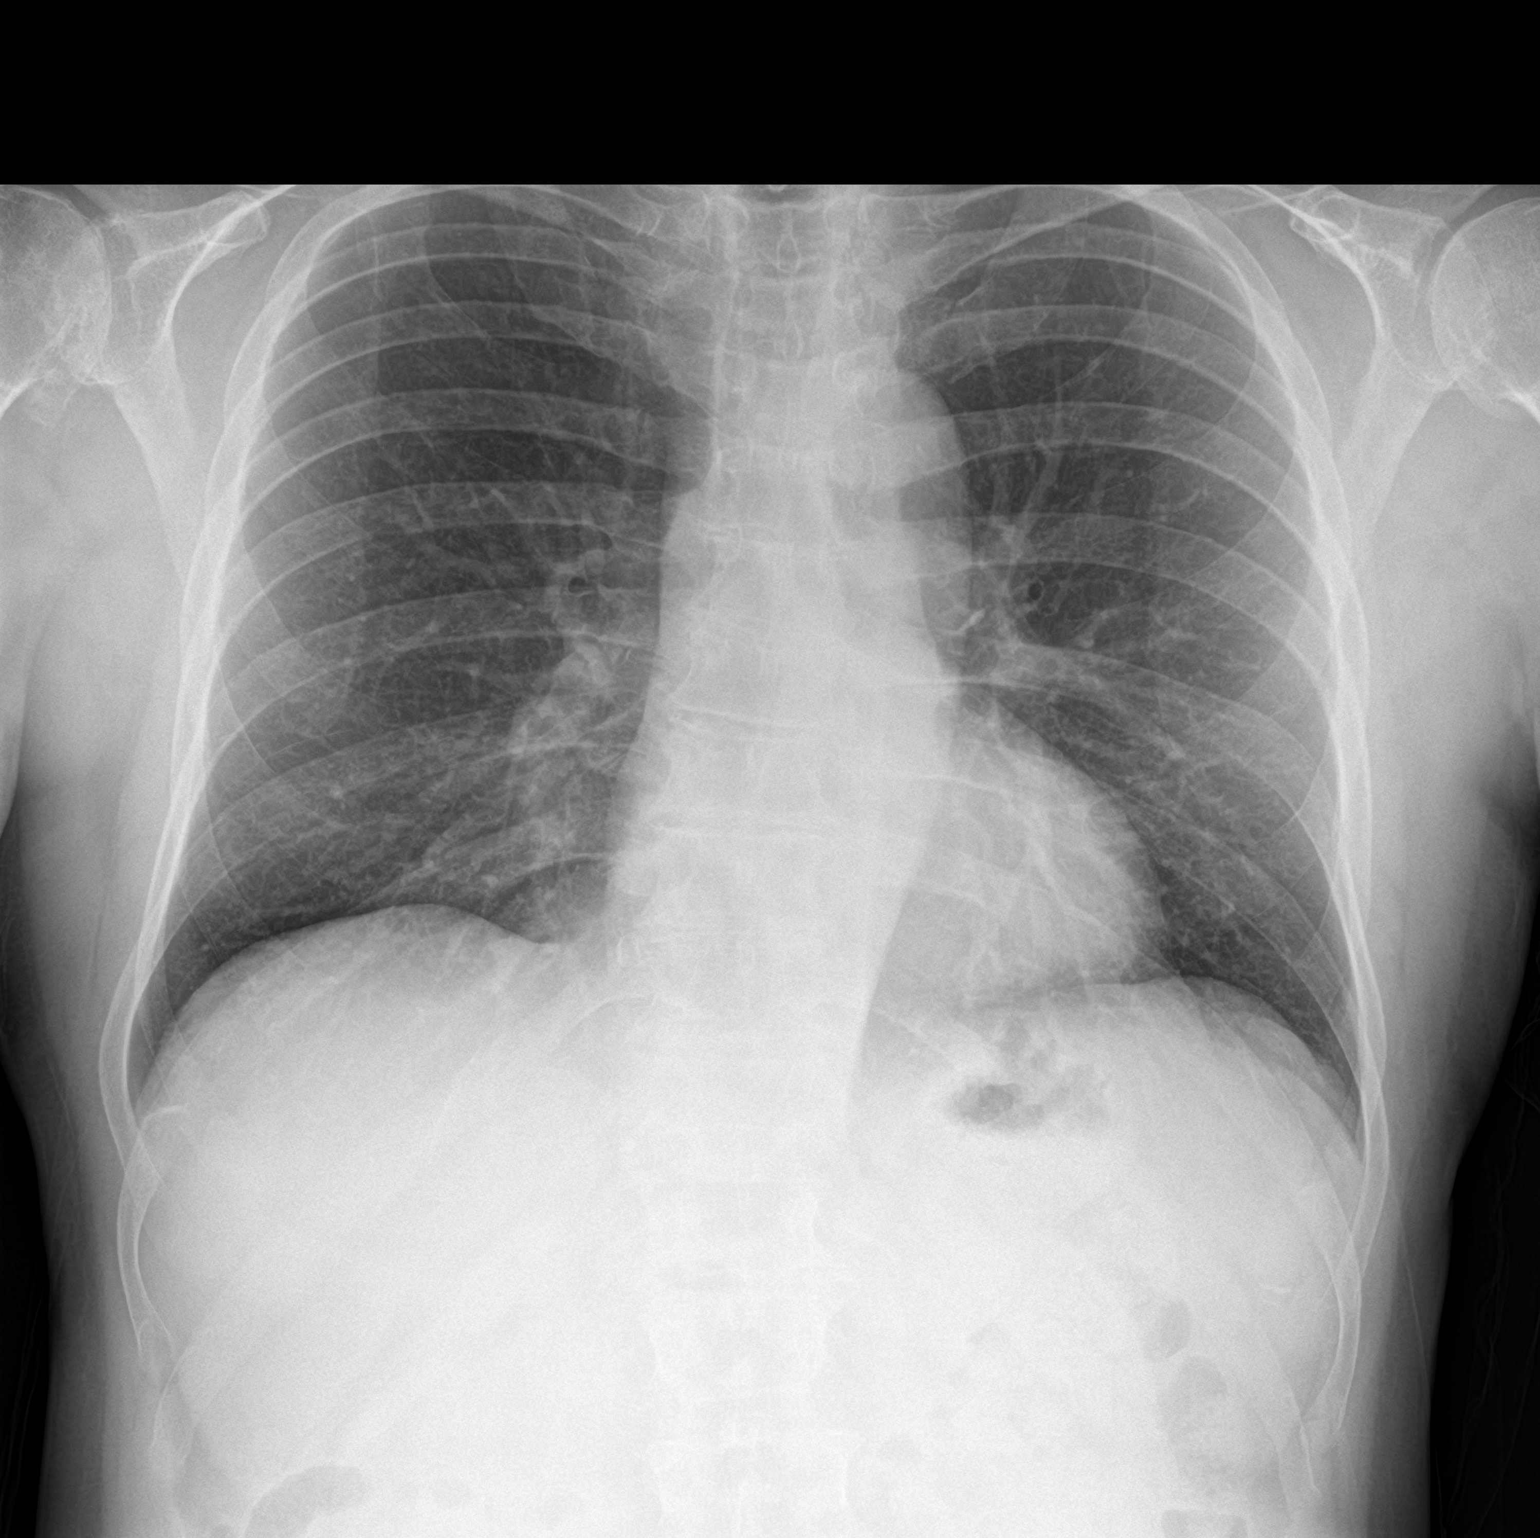

[chest lat]
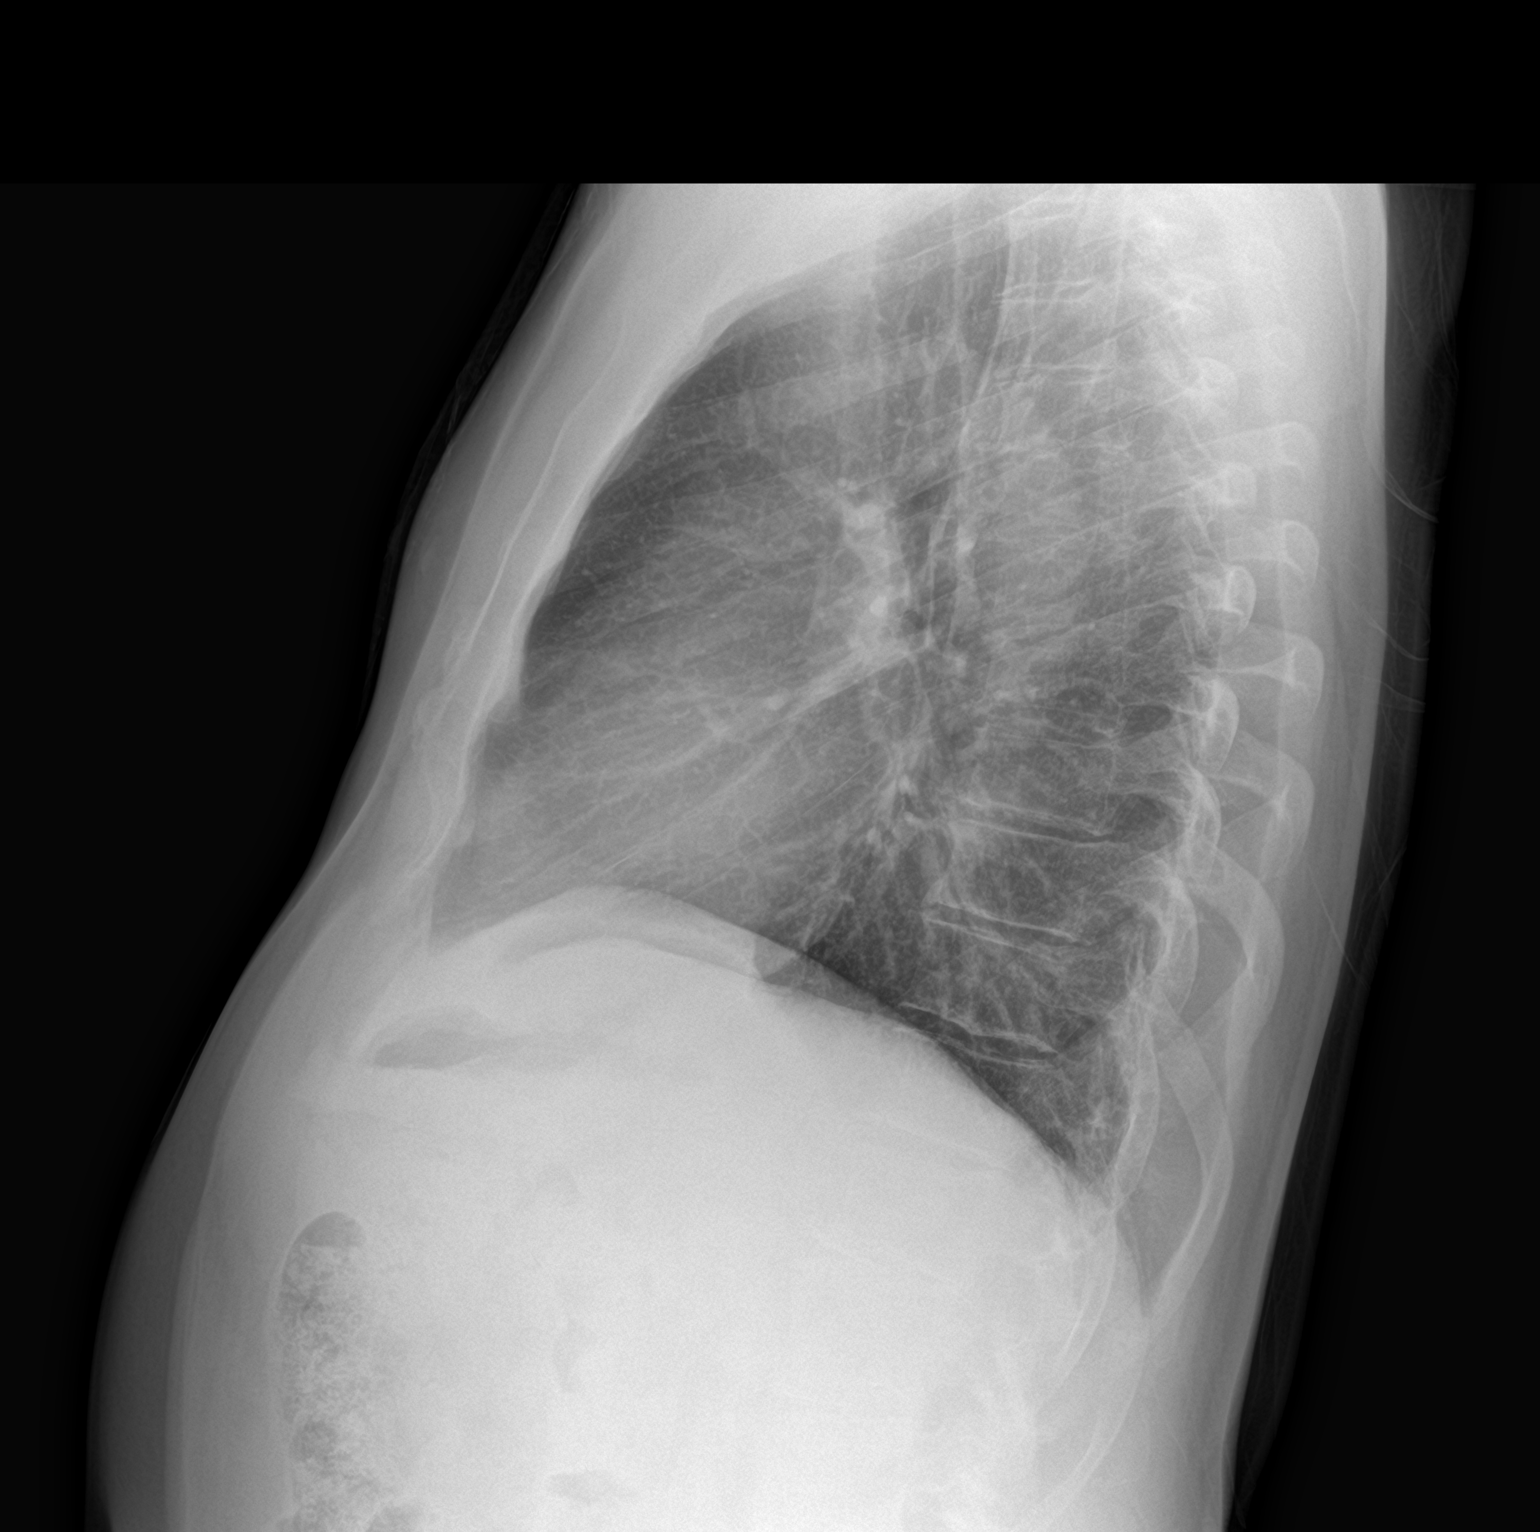

[2 of 2 positions shown; findings below may reference images not displayed]

FINDINGS: Lungs are clear. Heart size and pulmonary vascularity are normal. No
adenopathy. No evident blastic or lytic bone lesions. There is bony
overgrowth along each proximal humerus.
IMPRESSION: Lungs clear. No neoplastic focus. Bony overgrowth along each medial
proximal humerus.

## 2021-03-05 ENCOUNTER — Encounter (HOSPITAL_COMMUNITY): Payer: Self-pay | Admitting: Orthopedic Surgery

## 2021-03-07 DIAGNOSIS — J3081 Allergic rhinitis due to animal (cat) (dog) hair and dander: Secondary | ICD-10-CM | POA: Diagnosis not present

## 2021-03-07 DIAGNOSIS — J3089 Other allergic rhinitis: Secondary | ICD-10-CM | POA: Diagnosis not present

## 2021-03-07 DIAGNOSIS — J301 Allergic rhinitis due to pollen: Secondary | ICD-10-CM | POA: Diagnosis not present

## 2021-03-12 DIAGNOSIS — J343 Hypertrophy of nasal turbinates: Secondary | ICD-10-CM | POA: Diagnosis not present

## 2021-03-12 DIAGNOSIS — J310 Chronic rhinitis: Secondary | ICD-10-CM | POA: Diagnosis not present

## 2021-03-13 DIAGNOSIS — J3089 Other allergic rhinitis: Secondary | ICD-10-CM | POA: Diagnosis not present

## 2021-03-13 DIAGNOSIS — J3081 Allergic rhinitis due to animal (cat) (dog) hair and dander: Secondary | ICD-10-CM | POA: Diagnosis not present

## 2021-03-13 DIAGNOSIS — J301 Allergic rhinitis due to pollen: Secondary | ICD-10-CM | POA: Diagnosis not present

## 2021-03-14 DIAGNOSIS — Z9889 Other specified postprocedural states: Secondary | ICD-10-CM | POA: Diagnosis not present

## 2021-03-20 DIAGNOSIS — J301 Allergic rhinitis due to pollen: Secondary | ICD-10-CM | POA: Diagnosis not present

## 2021-03-20 DIAGNOSIS — J3089 Other allergic rhinitis: Secondary | ICD-10-CM | POA: Diagnosis not present

## 2021-03-20 DIAGNOSIS — J3081 Allergic rhinitis due to animal (cat) (dog) hair and dander: Secondary | ICD-10-CM | POA: Diagnosis not present

## 2021-03-22 DIAGNOSIS — J301 Allergic rhinitis due to pollen: Secondary | ICD-10-CM | POA: Diagnosis not present

## 2021-03-23 DIAGNOSIS — J3081 Allergic rhinitis due to animal (cat) (dog) hair and dander: Secondary | ICD-10-CM | POA: Diagnosis not present

## 2021-03-23 DIAGNOSIS — J3089 Other allergic rhinitis: Secondary | ICD-10-CM | POA: Diagnosis not present

## 2021-03-27 DIAGNOSIS — J3081 Allergic rhinitis due to animal (cat) (dog) hair and dander: Secondary | ICD-10-CM | POA: Diagnosis not present

## 2021-03-27 DIAGNOSIS — J301 Allergic rhinitis due to pollen: Secondary | ICD-10-CM | POA: Diagnosis not present

## 2021-03-27 DIAGNOSIS — J3089 Other allergic rhinitis: Secondary | ICD-10-CM | POA: Diagnosis not present

## 2021-04-03 DIAGNOSIS — J3081 Allergic rhinitis due to animal (cat) (dog) hair and dander: Secondary | ICD-10-CM | POA: Diagnosis not present

## 2021-04-03 DIAGNOSIS — J3089 Other allergic rhinitis: Secondary | ICD-10-CM | POA: Diagnosis not present

## 2021-04-03 DIAGNOSIS — J301 Allergic rhinitis due to pollen: Secondary | ICD-10-CM | POA: Diagnosis not present

## 2021-04-09 DIAGNOSIS — J3089 Other allergic rhinitis: Secondary | ICD-10-CM | POA: Diagnosis not present

## 2021-04-09 DIAGNOSIS — J301 Allergic rhinitis due to pollen: Secondary | ICD-10-CM | POA: Diagnosis not present

## 2021-04-11 DIAGNOSIS — Z96611 Presence of right artificial shoulder joint: Secondary | ICD-10-CM | POA: Diagnosis not present

## 2021-04-11 DIAGNOSIS — Z471 Aftercare following joint replacement surgery: Secondary | ICD-10-CM | POA: Diagnosis not present

## 2021-04-17 DIAGNOSIS — M6281 Muscle weakness (generalized): Secondary | ICD-10-CM | POA: Diagnosis not present

## 2021-04-17 DIAGNOSIS — M25611 Stiffness of right shoulder, not elsewhere classified: Secondary | ICD-10-CM | POA: Diagnosis not present

## 2021-04-17 DIAGNOSIS — Z96611 Presence of right artificial shoulder joint: Secondary | ICD-10-CM | POA: Diagnosis not present

## 2021-04-18 DIAGNOSIS — J3081 Allergic rhinitis due to animal (cat) (dog) hair and dander: Secondary | ICD-10-CM | POA: Diagnosis not present

## 2021-04-18 DIAGNOSIS — J3089 Other allergic rhinitis: Secondary | ICD-10-CM | POA: Diagnosis not present

## 2021-04-18 DIAGNOSIS — J301 Allergic rhinitis due to pollen: Secondary | ICD-10-CM | POA: Diagnosis not present

## 2021-04-24 DIAGNOSIS — J3089 Other allergic rhinitis: Secondary | ICD-10-CM | POA: Diagnosis not present

## 2021-04-24 DIAGNOSIS — J301 Allergic rhinitis due to pollen: Secondary | ICD-10-CM | POA: Diagnosis not present

## 2021-04-24 DIAGNOSIS — J3081 Allergic rhinitis due to animal (cat) (dog) hair and dander: Secondary | ICD-10-CM | POA: Diagnosis not present

## 2021-04-25 DIAGNOSIS — M25611 Stiffness of right shoulder, not elsewhere classified: Secondary | ICD-10-CM | POA: Diagnosis not present

## 2021-04-25 DIAGNOSIS — Z96611 Presence of right artificial shoulder joint: Secondary | ICD-10-CM | POA: Diagnosis not present

## 2021-04-25 DIAGNOSIS — M6281 Muscle weakness (generalized): Secondary | ICD-10-CM | POA: Diagnosis not present

## 2021-04-27 DIAGNOSIS — Z96611 Presence of right artificial shoulder joint: Secondary | ICD-10-CM | POA: Diagnosis not present

## 2021-04-27 DIAGNOSIS — M25611 Stiffness of right shoulder, not elsewhere classified: Secondary | ICD-10-CM | POA: Diagnosis not present

## 2021-04-27 DIAGNOSIS — M6281 Muscle weakness (generalized): Secondary | ICD-10-CM | POA: Diagnosis not present

## 2021-04-30 DIAGNOSIS — Z96611 Presence of right artificial shoulder joint: Secondary | ICD-10-CM | POA: Diagnosis not present

## 2021-04-30 DIAGNOSIS — M25611 Stiffness of right shoulder, not elsewhere classified: Secondary | ICD-10-CM | POA: Diagnosis not present

## 2021-04-30 DIAGNOSIS — M6281 Muscle weakness (generalized): Secondary | ICD-10-CM | POA: Diagnosis not present

## 2021-05-01 DIAGNOSIS — J3081 Allergic rhinitis due to animal (cat) (dog) hair and dander: Secondary | ICD-10-CM | POA: Diagnosis not present

## 2021-05-01 DIAGNOSIS — J301 Allergic rhinitis due to pollen: Secondary | ICD-10-CM | POA: Diagnosis not present

## 2021-05-01 DIAGNOSIS — J3089 Other allergic rhinitis: Secondary | ICD-10-CM | POA: Diagnosis not present

## 2021-05-02 DIAGNOSIS — R350 Frequency of micturition: Secondary | ICD-10-CM | POA: Diagnosis not present

## 2021-05-02 DIAGNOSIS — M6281 Muscle weakness (generalized): Secondary | ICD-10-CM | POA: Diagnosis not present

## 2021-05-02 DIAGNOSIS — M25611 Stiffness of right shoulder, not elsewhere classified: Secondary | ICD-10-CM | POA: Diagnosis not present

## 2021-05-02 DIAGNOSIS — N401 Enlarged prostate with lower urinary tract symptoms: Secondary | ICD-10-CM | POA: Diagnosis not present

## 2021-05-02 DIAGNOSIS — Z96611 Presence of right artificial shoulder joint: Secondary | ICD-10-CM | POA: Diagnosis not present

## 2021-05-09 DIAGNOSIS — J301 Allergic rhinitis due to pollen: Secondary | ICD-10-CM | POA: Diagnosis not present

## 2021-05-09 DIAGNOSIS — J3089 Other allergic rhinitis: Secondary | ICD-10-CM | POA: Diagnosis not present

## 2021-05-09 DIAGNOSIS — M6281 Muscle weakness (generalized): Secondary | ICD-10-CM | POA: Diagnosis not present

## 2021-05-09 DIAGNOSIS — Z96611 Presence of right artificial shoulder joint: Secondary | ICD-10-CM | POA: Diagnosis not present

## 2021-05-09 DIAGNOSIS — M25611 Stiffness of right shoulder, not elsewhere classified: Secondary | ICD-10-CM | POA: Diagnosis not present

## 2021-05-09 DIAGNOSIS — J3081 Allergic rhinitis due to animal (cat) (dog) hair and dander: Secondary | ICD-10-CM | POA: Diagnosis not present

## 2021-05-14 DIAGNOSIS — M25611 Stiffness of right shoulder, not elsewhere classified: Secondary | ICD-10-CM | POA: Diagnosis not present

## 2021-05-14 DIAGNOSIS — N401 Enlarged prostate with lower urinary tract symptoms: Secondary | ICD-10-CM | POA: Diagnosis not present

## 2021-05-14 DIAGNOSIS — N5201 Erectile dysfunction due to arterial insufficiency: Secondary | ICD-10-CM | POA: Diagnosis not present

## 2021-05-14 DIAGNOSIS — M6281 Muscle weakness (generalized): Secondary | ICD-10-CM | POA: Diagnosis not present

## 2021-05-14 DIAGNOSIS — R3912 Poor urinary stream: Secondary | ICD-10-CM | POA: Diagnosis not present

## 2021-05-14 DIAGNOSIS — Z8546 Personal history of malignant neoplasm of prostate: Secondary | ICD-10-CM | POA: Diagnosis not present

## 2021-05-14 DIAGNOSIS — Z96611 Presence of right artificial shoulder joint: Secondary | ICD-10-CM | POA: Diagnosis not present

## 2021-05-16 DIAGNOSIS — J3089 Other allergic rhinitis: Secondary | ICD-10-CM | POA: Diagnosis not present

## 2021-05-16 DIAGNOSIS — J3081 Allergic rhinitis due to animal (cat) (dog) hair and dander: Secondary | ICD-10-CM | POA: Diagnosis not present

## 2021-05-16 DIAGNOSIS — J301 Allergic rhinitis due to pollen: Secondary | ICD-10-CM | POA: Diagnosis not present

## 2021-05-21 DIAGNOSIS — J301 Allergic rhinitis due to pollen: Secondary | ICD-10-CM | POA: Diagnosis not present

## 2021-05-21 DIAGNOSIS — J3081 Allergic rhinitis due to animal (cat) (dog) hair and dander: Secondary | ICD-10-CM | POA: Diagnosis not present

## 2021-05-21 DIAGNOSIS — J3089 Other allergic rhinitis: Secondary | ICD-10-CM | POA: Diagnosis not present

## 2021-05-29 DIAGNOSIS — J3081 Allergic rhinitis due to animal (cat) (dog) hair and dander: Secondary | ICD-10-CM | POA: Diagnosis not present

## 2021-05-29 DIAGNOSIS — J3089 Other allergic rhinitis: Secondary | ICD-10-CM | POA: Diagnosis not present

## 2021-05-29 DIAGNOSIS — J301 Allergic rhinitis due to pollen: Secondary | ICD-10-CM | POA: Diagnosis not present

## 2021-06-05 DIAGNOSIS — J301 Allergic rhinitis due to pollen: Secondary | ICD-10-CM | POA: Diagnosis not present

## 2021-06-05 DIAGNOSIS — J3089 Other allergic rhinitis: Secondary | ICD-10-CM | POA: Diagnosis not present

## 2021-06-12 DIAGNOSIS — J3089 Other allergic rhinitis: Secondary | ICD-10-CM | POA: Diagnosis not present

## 2021-06-12 DIAGNOSIS — J301 Allergic rhinitis due to pollen: Secondary | ICD-10-CM | POA: Diagnosis not present

## 2021-06-12 DIAGNOSIS — J3081 Allergic rhinitis due to animal (cat) (dog) hair and dander: Secondary | ICD-10-CM | POA: Diagnosis not present

## 2021-06-15 DIAGNOSIS — E782 Mixed hyperlipidemia: Secondary | ICD-10-CM | POA: Diagnosis not present

## 2021-06-15 DIAGNOSIS — I10 Essential (primary) hypertension: Secondary | ICD-10-CM | POA: Diagnosis not present

## 2021-06-15 DIAGNOSIS — Z0000 Encounter for general adult medical examination without abnormal findings: Secondary | ICD-10-CM | POA: Diagnosis not present

## 2021-06-19 DIAGNOSIS — J301 Allergic rhinitis due to pollen: Secondary | ICD-10-CM | POA: Diagnosis not present

## 2021-06-19 DIAGNOSIS — J3081 Allergic rhinitis due to animal (cat) (dog) hair and dander: Secondary | ICD-10-CM | POA: Diagnosis not present

## 2021-06-19 DIAGNOSIS — J3089 Other allergic rhinitis: Secondary | ICD-10-CM | POA: Diagnosis not present

## 2021-06-26 DIAGNOSIS — J3081 Allergic rhinitis due to animal (cat) (dog) hair and dander: Secondary | ICD-10-CM | POA: Diagnosis not present

## 2021-06-26 DIAGNOSIS — J3089 Other allergic rhinitis: Secondary | ICD-10-CM | POA: Diagnosis not present

## 2021-06-26 DIAGNOSIS — J301 Allergic rhinitis due to pollen: Secondary | ICD-10-CM | POA: Diagnosis not present

## 2021-07-03 DIAGNOSIS — J3089 Other allergic rhinitis: Secondary | ICD-10-CM | POA: Diagnosis not present

## 2021-07-03 DIAGNOSIS — J301 Allergic rhinitis due to pollen: Secondary | ICD-10-CM | POA: Diagnosis not present

## 2021-07-10 DIAGNOSIS — J301 Allergic rhinitis due to pollen: Secondary | ICD-10-CM | POA: Diagnosis not present

## 2021-07-10 DIAGNOSIS — J3089 Other allergic rhinitis: Secondary | ICD-10-CM | POA: Diagnosis not present

## 2021-07-10 DIAGNOSIS — J3081 Allergic rhinitis due to animal (cat) (dog) hair and dander: Secondary | ICD-10-CM | POA: Diagnosis not present

## 2021-07-17 DIAGNOSIS — J3089 Other allergic rhinitis: Secondary | ICD-10-CM | POA: Diagnosis not present

## 2021-07-17 DIAGNOSIS — J3081 Allergic rhinitis due to animal (cat) (dog) hair and dander: Secondary | ICD-10-CM | POA: Diagnosis not present

## 2021-07-17 DIAGNOSIS — J301 Allergic rhinitis due to pollen: Secondary | ICD-10-CM | POA: Diagnosis not present

## 2021-07-24 DIAGNOSIS — J3081 Allergic rhinitis due to animal (cat) (dog) hair and dander: Secondary | ICD-10-CM | POA: Diagnosis not present

## 2021-07-24 DIAGNOSIS — J301 Allergic rhinitis due to pollen: Secondary | ICD-10-CM | POA: Diagnosis not present

## 2021-07-24 DIAGNOSIS — J3089 Other allergic rhinitis: Secondary | ICD-10-CM | POA: Diagnosis not present

## 2021-07-31 DIAGNOSIS — Z23 Encounter for immunization: Secondary | ICD-10-CM | POA: Diagnosis not present

## 2021-07-31 DIAGNOSIS — J301 Allergic rhinitis due to pollen: Secondary | ICD-10-CM | POA: Diagnosis not present

## 2021-07-31 DIAGNOSIS — J3081 Allergic rhinitis due to animal (cat) (dog) hair and dander: Secondary | ICD-10-CM | POA: Diagnosis not present

## 2021-07-31 DIAGNOSIS — J3089 Other allergic rhinitis: Secondary | ICD-10-CM | POA: Diagnosis not present

## 2021-08-07 DIAGNOSIS — L570 Actinic keratosis: Secondary | ICD-10-CM | POA: Diagnosis not present

## 2021-08-07 DIAGNOSIS — J3089 Other allergic rhinitis: Secondary | ICD-10-CM | POA: Diagnosis not present

## 2021-08-07 DIAGNOSIS — J301 Allergic rhinitis due to pollen: Secondary | ICD-10-CM | POA: Diagnosis not present

## 2021-08-07 DIAGNOSIS — B079 Viral wart, unspecified: Secondary | ICD-10-CM | POA: Diagnosis not present

## 2021-08-07 DIAGNOSIS — J3081 Allergic rhinitis due to animal (cat) (dog) hair and dander: Secondary | ICD-10-CM | POA: Diagnosis not present

## 2021-08-10 IMAGING — CR DG CHEST 2V
2 series · 2 of 2 positions shown · non-contrast
Comparison: 12/16/2019

CLINICAL DATA: Chest pain

EXAM:
CHEST - 2 VIEW

[chest pa]
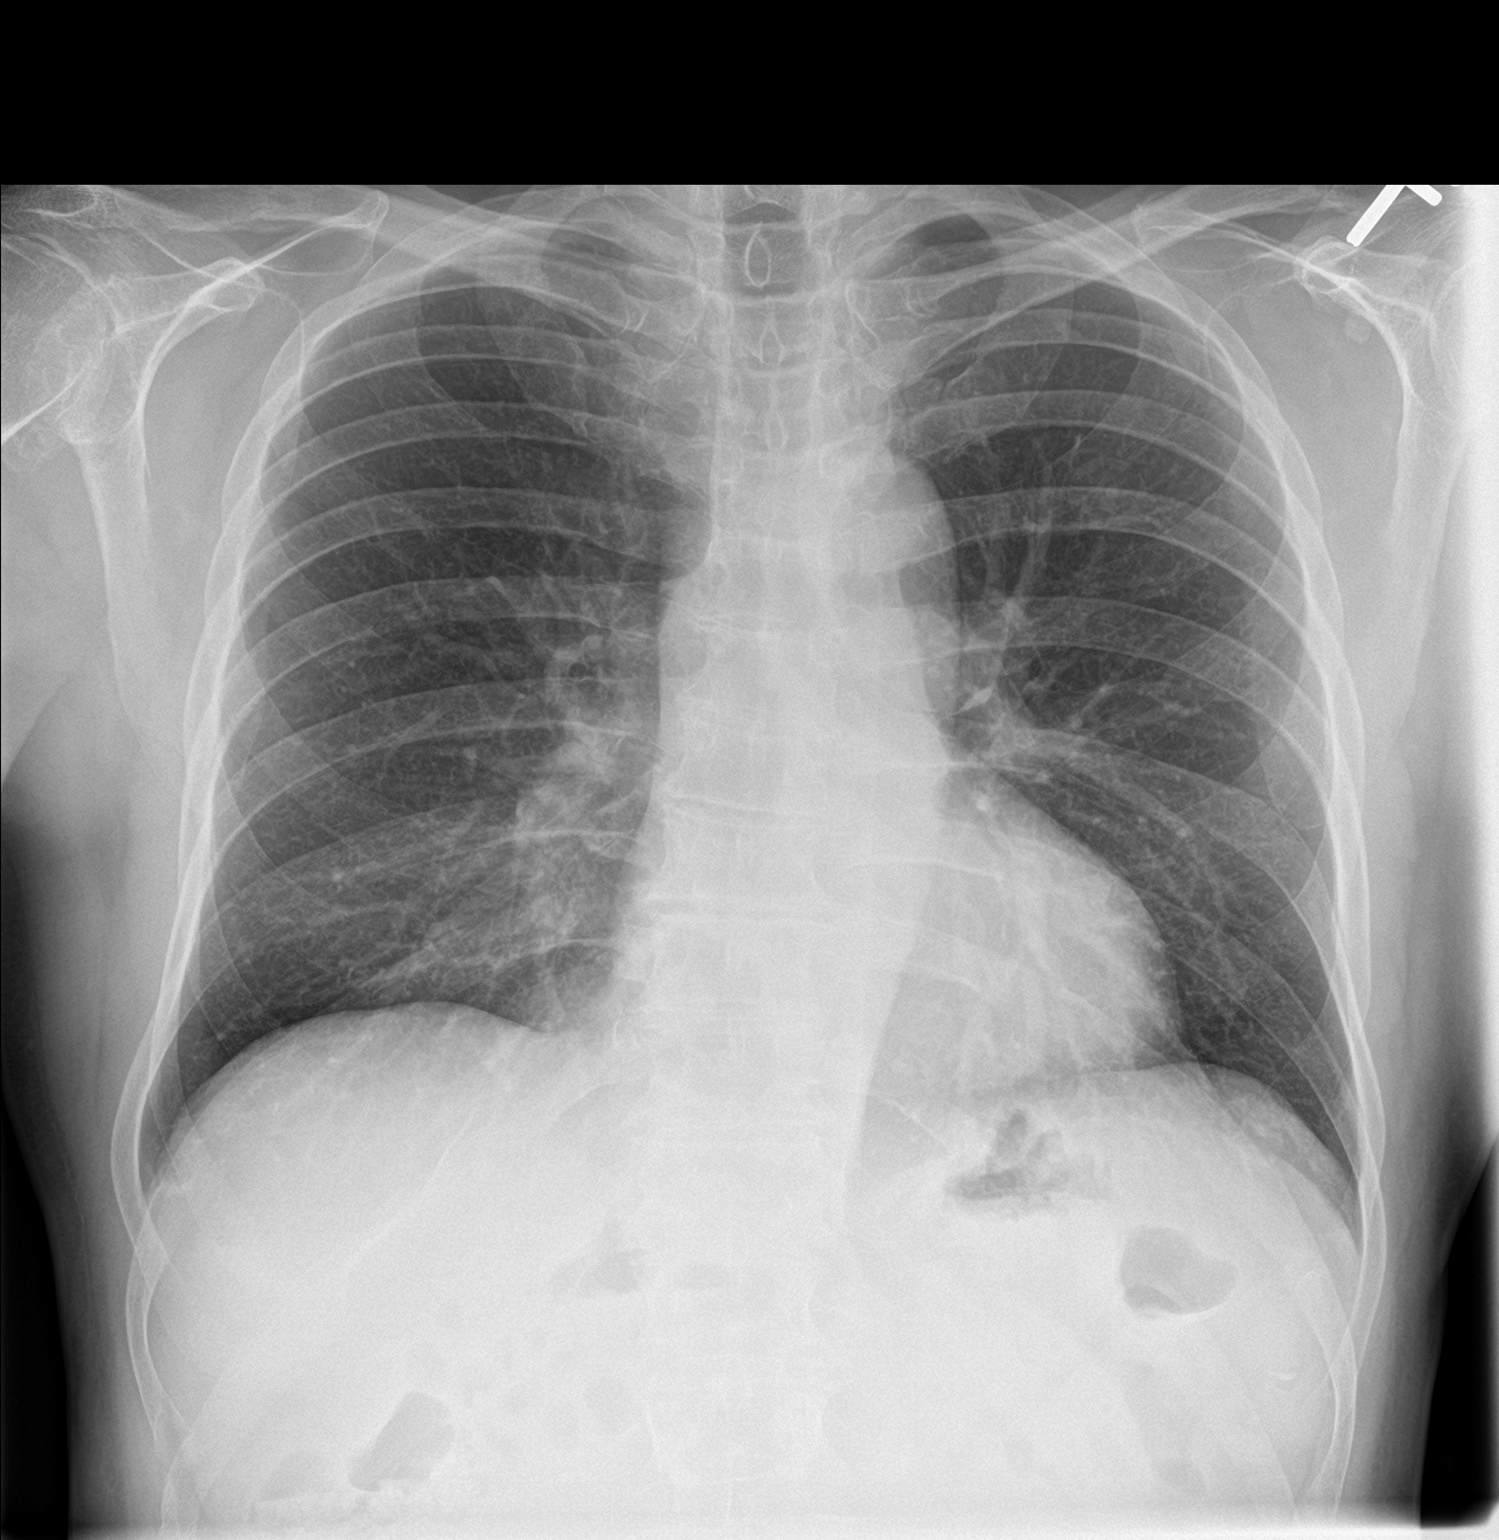

[chest lat]
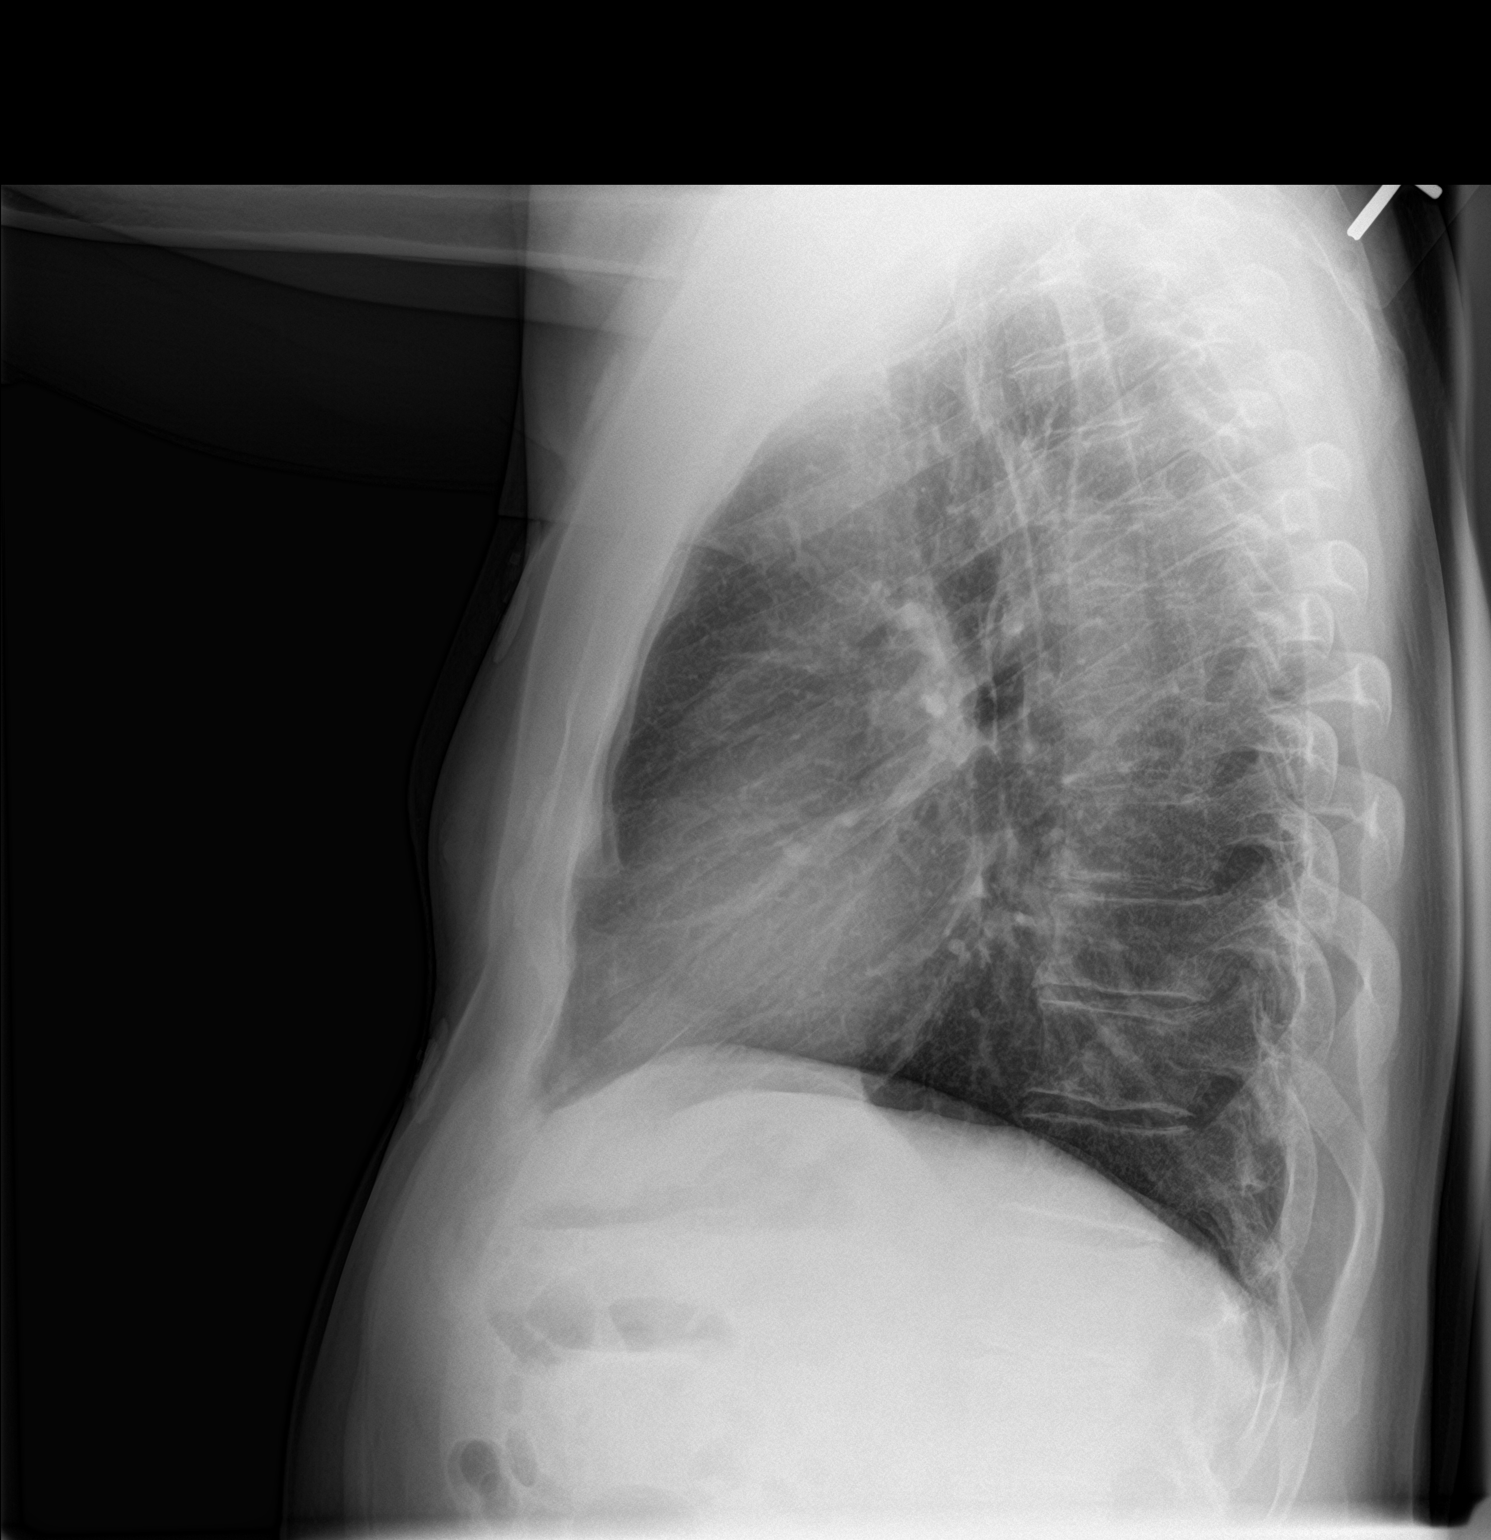

[2 of 2 positions shown; findings below may reference images not displayed]

FINDINGS: The heart size and mediastinal contours are within normal limits.
Both lungs are clear. The visualized skeletal structures are
unremarkable.
IMPRESSION: Normal study.

## 2021-08-14 DIAGNOSIS — J3089 Other allergic rhinitis: Secondary | ICD-10-CM | POA: Diagnosis not present

## 2021-08-14 DIAGNOSIS — J3081 Allergic rhinitis due to animal (cat) (dog) hair and dander: Secondary | ICD-10-CM | POA: Diagnosis not present

## 2021-08-14 DIAGNOSIS — J301 Allergic rhinitis due to pollen: Secondary | ICD-10-CM | POA: Diagnosis not present

## 2021-08-21 DIAGNOSIS — J3089 Other allergic rhinitis: Secondary | ICD-10-CM | POA: Diagnosis not present

## 2021-08-21 DIAGNOSIS — J3081 Allergic rhinitis due to animal (cat) (dog) hair and dander: Secondary | ICD-10-CM | POA: Diagnosis not present

## 2021-08-21 DIAGNOSIS — J301 Allergic rhinitis due to pollen: Secondary | ICD-10-CM | POA: Diagnosis not present

## 2021-08-27 DIAGNOSIS — J301 Allergic rhinitis due to pollen: Secondary | ICD-10-CM | POA: Diagnosis not present

## 2021-08-28 DIAGNOSIS — J3081 Allergic rhinitis due to animal (cat) (dog) hair and dander: Secondary | ICD-10-CM | POA: Diagnosis not present

## 2021-08-28 DIAGNOSIS — J3089 Other allergic rhinitis: Secondary | ICD-10-CM | POA: Diagnosis not present

## 2021-08-29 DIAGNOSIS — J301 Allergic rhinitis due to pollen: Secondary | ICD-10-CM | POA: Diagnosis not present

## 2021-08-29 DIAGNOSIS — J3089 Other allergic rhinitis: Secondary | ICD-10-CM | POA: Diagnosis not present

## 2021-08-29 DIAGNOSIS — J3081 Allergic rhinitis due to animal (cat) (dog) hair and dander: Secondary | ICD-10-CM | POA: Diagnosis not present

## 2021-08-31 DIAGNOSIS — Z8546 Personal history of malignant neoplasm of prostate: Secondary | ICD-10-CM | POA: Diagnosis not present

## 2021-08-31 DIAGNOSIS — R102 Pelvic and perineal pain: Secondary | ICD-10-CM | POA: Diagnosis not present

## 2021-09-05 DIAGNOSIS — J3081 Allergic rhinitis due to animal (cat) (dog) hair and dander: Secondary | ICD-10-CM | POA: Diagnosis not present

## 2021-09-05 DIAGNOSIS — M19011 Primary osteoarthritis, right shoulder: Secondary | ICD-10-CM | POA: Diagnosis not present

## 2021-09-05 DIAGNOSIS — Z96611 Presence of right artificial shoulder joint: Secondary | ICD-10-CM | POA: Diagnosis not present

## 2021-09-05 DIAGNOSIS — J301 Allergic rhinitis due to pollen: Secondary | ICD-10-CM | POA: Diagnosis not present

## 2021-09-05 DIAGNOSIS — J3089 Other allergic rhinitis: Secondary | ICD-10-CM | POA: Diagnosis not present

## 2021-09-11 DIAGNOSIS — J3081 Allergic rhinitis due to animal (cat) (dog) hair and dander: Secondary | ICD-10-CM | POA: Diagnosis not present

## 2021-09-11 DIAGNOSIS — J3089 Other allergic rhinitis: Secondary | ICD-10-CM | POA: Diagnosis not present

## 2021-09-11 DIAGNOSIS — J301 Allergic rhinitis due to pollen: Secondary | ICD-10-CM | POA: Diagnosis not present

## 2021-09-18 DIAGNOSIS — J301 Allergic rhinitis due to pollen: Secondary | ICD-10-CM | POA: Diagnosis not present

## 2021-09-18 DIAGNOSIS — J3089 Other allergic rhinitis: Secondary | ICD-10-CM | POA: Diagnosis not present

## 2021-09-18 DIAGNOSIS — J3081 Allergic rhinitis due to animal (cat) (dog) hair and dander: Secondary | ICD-10-CM | POA: Diagnosis not present

## 2021-09-24 DIAGNOSIS — J301 Allergic rhinitis due to pollen: Secondary | ICD-10-CM | POA: Diagnosis not present

## 2021-09-24 DIAGNOSIS — J3081 Allergic rhinitis due to animal (cat) (dog) hair and dander: Secondary | ICD-10-CM | POA: Diagnosis not present

## 2021-09-24 DIAGNOSIS — J3089 Other allergic rhinitis: Secondary | ICD-10-CM | POA: Diagnosis not present

## 2021-09-26 DIAGNOSIS — H903 Sensorineural hearing loss, bilateral: Secondary | ICD-10-CM | POA: Diagnosis not present

## 2021-10-03 DIAGNOSIS — J3089 Other allergic rhinitis: Secondary | ICD-10-CM | POA: Diagnosis not present

## 2021-10-03 DIAGNOSIS — J301 Allergic rhinitis due to pollen: Secondary | ICD-10-CM | POA: Diagnosis not present

## 2021-10-03 DIAGNOSIS — J3081 Allergic rhinitis due to animal (cat) (dog) hair and dander: Secondary | ICD-10-CM | POA: Diagnosis not present

## 2021-10-09 DIAGNOSIS — J301 Allergic rhinitis due to pollen: Secondary | ICD-10-CM | POA: Diagnosis not present

## 2021-10-09 DIAGNOSIS — J3089 Other allergic rhinitis: Secondary | ICD-10-CM | POA: Diagnosis not present

## 2021-10-09 DIAGNOSIS — J3081 Allergic rhinitis due to animal (cat) (dog) hair and dander: Secondary | ICD-10-CM | POA: Diagnosis not present

## 2021-10-17 DIAGNOSIS — J3081 Allergic rhinitis due to animal (cat) (dog) hair and dander: Secondary | ICD-10-CM | POA: Diagnosis not present

## 2021-10-17 DIAGNOSIS — J301 Allergic rhinitis due to pollen: Secondary | ICD-10-CM | POA: Diagnosis not present

## 2021-10-17 DIAGNOSIS — J3089 Other allergic rhinitis: Secondary | ICD-10-CM | POA: Diagnosis not present

## 2021-10-23 DIAGNOSIS — J3089 Other allergic rhinitis: Secondary | ICD-10-CM | POA: Diagnosis not present

## 2021-10-23 DIAGNOSIS — J301 Allergic rhinitis due to pollen: Secondary | ICD-10-CM | POA: Diagnosis not present

## 2021-10-23 DIAGNOSIS — J3081 Allergic rhinitis due to animal (cat) (dog) hair and dander: Secondary | ICD-10-CM | POA: Diagnosis not present

## 2021-10-24 DIAGNOSIS — Z8546 Personal history of malignant neoplasm of prostate: Secondary | ICD-10-CM | POA: Diagnosis not present

## 2021-10-30 DIAGNOSIS — J301 Allergic rhinitis due to pollen: Secondary | ICD-10-CM | POA: Diagnosis not present

## 2021-10-30 DIAGNOSIS — J3089 Other allergic rhinitis: Secondary | ICD-10-CM | POA: Diagnosis not present

## 2021-10-30 DIAGNOSIS — J3081 Allergic rhinitis due to animal (cat) (dog) hair and dander: Secondary | ICD-10-CM | POA: Diagnosis not present

## 2021-10-31 DIAGNOSIS — Z8546 Personal history of malignant neoplasm of prostate: Secondary | ICD-10-CM | POA: Diagnosis not present

## 2021-10-31 DIAGNOSIS — N401 Enlarged prostate with lower urinary tract symptoms: Secondary | ICD-10-CM | POA: Diagnosis not present

## 2021-10-31 DIAGNOSIS — R351 Nocturia: Secondary | ICD-10-CM | POA: Diagnosis not present

## 2021-10-31 DIAGNOSIS — N500 Atrophy of testis: Secondary | ICD-10-CM | POA: Diagnosis not present

## 2021-11-06 DIAGNOSIS — L570 Actinic keratosis: Secondary | ICD-10-CM | POA: Diagnosis not present

## 2021-11-06 DIAGNOSIS — H6121 Impacted cerumen, right ear: Secondary | ICD-10-CM | POA: Diagnosis not present

## 2021-11-07 ENCOUNTER — Other Ambulatory Visit: Payer: Self-pay | Admitting: Cardiovascular Disease

## 2021-11-07 DIAGNOSIS — J301 Allergic rhinitis due to pollen: Secondary | ICD-10-CM | POA: Diagnosis not present

## 2021-11-07 DIAGNOSIS — J3089 Other allergic rhinitis: Secondary | ICD-10-CM | POA: Diagnosis not present

## 2021-11-07 DIAGNOSIS — J3081 Allergic rhinitis due to animal (cat) (dog) hair and dander: Secondary | ICD-10-CM | POA: Diagnosis not present

## 2021-11-14 DIAGNOSIS — J3089 Other allergic rhinitis: Secondary | ICD-10-CM | POA: Diagnosis not present

## 2021-11-14 DIAGNOSIS — J301 Allergic rhinitis due to pollen: Secondary | ICD-10-CM | POA: Diagnosis not present

## 2021-11-20 DIAGNOSIS — J301 Allergic rhinitis due to pollen: Secondary | ICD-10-CM | POA: Diagnosis not present

## 2021-11-20 DIAGNOSIS — J3081 Allergic rhinitis due to animal (cat) (dog) hair and dander: Secondary | ICD-10-CM | POA: Diagnosis not present

## 2021-11-20 DIAGNOSIS — J3089 Other allergic rhinitis: Secondary | ICD-10-CM | POA: Diagnosis not present

## 2021-11-28 DIAGNOSIS — J301 Allergic rhinitis due to pollen: Secondary | ICD-10-CM | POA: Diagnosis not present

## 2021-11-28 DIAGNOSIS — J3081 Allergic rhinitis due to animal (cat) (dog) hair and dander: Secondary | ICD-10-CM | POA: Diagnosis not present

## 2021-12-04 DIAGNOSIS — J301 Allergic rhinitis due to pollen: Secondary | ICD-10-CM | POA: Diagnosis not present

## 2021-12-04 DIAGNOSIS — J3089 Other allergic rhinitis: Secondary | ICD-10-CM | POA: Diagnosis not present

## 2021-12-04 DIAGNOSIS — J3081 Allergic rhinitis due to animal (cat) (dog) hair and dander: Secondary | ICD-10-CM | POA: Diagnosis not present

## 2021-12-12 DIAGNOSIS — J3089 Other allergic rhinitis: Secondary | ICD-10-CM | POA: Diagnosis not present

## 2021-12-12 DIAGNOSIS — J301 Allergic rhinitis due to pollen: Secondary | ICD-10-CM | POA: Diagnosis not present

## 2021-12-12 DIAGNOSIS — J3081 Allergic rhinitis due to animal (cat) (dog) hair and dander: Secondary | ICD-10-CM | POA: Diagnosis not present

## 2021-12-18 DIAGNOSIS — J3081 Allergic rhinitis due to animal (cat) (dog) hair and dander: Secondary | ICD-10-CM | POA: Diagnosis not present

## 2021-12-18 DIAGNOSIS — J3089 Other allergic rhinitis: Secondary | ICD-10-CM | POA: Diagnosis not present

## 2021-12-18 DIAGNOSIS — J301 Allergic rhinitis due to pollen: Secondary | ICD-10-CM | POA: Diagnosis not present

## 2021-12-25 DIAGNOSIS — J3081 Allergic rhinitis due to animal (cat) (dog) hair and dander: Secondary | ICD-10-CM | POA: Diagnosis not present

## 2021-12-25 DIAGNOSIS — J301 Allergic rhinitis due to pollen: Secondary | ICD-10-CM | POA: Diagnosis not present

## 2022-01-04 DIAGNOSIS — J301 Allergic rhinitis due to pollen: Secondary | ICD-10-CM | POA: Diagnosis not present

## 2022-01-04 DIAGNOSIS — J3089 Other allergic rhinitis: Secondary | ICD-10-CM | POA: Diagnosis not present

## 2022-01-09 DIAGNOSIS — J3089 Other allergic rhinitis: Secondary | ICD-10-CM | POA: Diagnosis not present

## 2022-01-09 DIAGNOSIS — J301 Allergic rhinitis due to pollen: Secondary | ICD-10-CM | POA: Diagnosis not present

## 2022-01-09 DIAGNOSIS — J3081 Allergic rhinitis due to animal (cat) (dog) hair and dander: Secondary | ICD-10-CM | POA: Diagnosis not present

## 2022-01-15 DIAGNOSIS — J3081 Allergic rhinitis due to animal (cat) (dog) hair and dander: Secondary | ICD-10-CM | POA: Diagnosis not present

## 2022-01-15 DIAGNOSIS — J3089 Other allergic rhinitis: Secondary | ICD-10-CM | POA: Diagnosis not present

## 2022-01-15 DIAGNOSIS — J301 Allergic rhinitis due to pollen: Secondary | ICD-10-CM | POA: Diagnosis not present

## 2022-01-17 DIAGNOSIS — N500 Atrophy of testis: Secondary | ICD-10-CM | POA: Diagnosis not present

## 2022-01-19 DIAGNOSIS — J301 Allergic rhinitis due to pollen: Secondary | ICD-10-CM | POA: Diagnosis not present

## 2022-01-21 DIAGNOSIS — J3081 Allergic rhinitis due to animal (cat) (dog) hair and dander: Secondary | ICD-10-CM | POA: Diagnosis not present

## 2022-01-21 DIAGNOSIS — J3089 Other allergic rhinitis: Secondary | ICD-10-CM | POA: Diagnosis not present

## 2022-01-22 DIAGNOSIS — J301 Allergic rhinitis due to pollen: Secondary | ICD-10-CM | POA: Diagnosis not present

## 2022-01-22 DIAGNOSIS — J3081 Allergic rhinitis due to animal (cat) (dog) hair and dander: Secondary | ICD-10-CM | POA: Diagnosis not present

## 2022-01-22 DIAGNOSIS — J3089 Other allergic rhinitis: Secondary | ICD-10-CM | POA: Diagnosis not present

## 2022-01-29 DIAGNOSIS — J301 Allergic rhinitis due to pollen: Secondary | ICD-10-CM | POA: Diagnosis not present

## 2022-01-29 DIAGNOSIS — J3089 Other allergic rhinitis: Secondary | ICD-10-CM | POA: Diagnosis not present

## 2022-01-29 DIAGNOSIS — J3081 Allergic rhinitis due to animal (cat) (dog) hair and dander: Secondary | ICD-10-CM | POA: Diagnosis not present

## 2022-02-04 ENCOUNTER — Other Ambulatory Visit: Payer: Self-pay | Admitting: Cardiovascular Disease

## 2022-02-05 ENCOUNTER — Other Ambulatory Visit: Payer: Self-pay | Admitting: Cardiovascular Disease

## 2022-02-05 DIAGNOSIS — J3089 Other allergic rhinitis: Secondary | ICD-10-CM | POA: Diagnosis not present

## 2022-02-05 DIAGNOSIS — J301 Allergic rhinitis due to pollen: Secondary | ICD-10-CM | POA: Diagnosis not present

## 2022-02-05 DIAGNOSIS — J3081 Allergic rhinitis due to animal (cat) (dog) hair and dander: Secondary | ICD-10-CM | POA: Diagnosis not present

## 2022-02-12 DIAGNOSIS — J3089 Other allergic rhinitis: Secondary | ICD-10-CM | POA: Diagnosis not present

## 2022-02-12 DIAGNOSIS — J3081 Allergic rhinitis due to animal (cat) (dog) hair and dander: Secondary | ICD-10-CM | POA: Diagnosis not present

## 2022-02-12 DIAGNOSIS — J301 Allergic rhinitis due to pollen: Secondary | ICD-10-CM | POA: Diagnosis not present

## 2022-02-19 DIAGNOSIS — J3081 Allergic rhinitis due to animal (cat) (dog) hair and dander: Secondary | ICD-10-CM | POA: Diagnosis not present

## 2022-02-19 DIAGNOSIS — J301 Allergic rhinitis due to pollen: Secondary | ICD-10-CM | POA: Diagnosis not present

## 2022-02-19 DIAGNOSIS — J3089 Other allergic rhinitis: Secondary | ICD-10-CM | POA: Diagnosis not present

## 2022-02-26 DIAGNOSIS — J3081 Allergic rhinitis due to animal (cat) (dog) hair and dander: Secondary | ICD-10-CM | POA: Diagnosis not present

## 2022-02-26 DIAGNOSIS — J301 Allergic rhinitis due to pollen: Secondary | ICD-10-CM | POA: Diagnosis not present

## 2022-02-27 DIAGNOSIS — X32XXXD Exposure to sunlight, subsequent encounter: Secondary | ICD-10-CM | POA: Diagnosis not present

## 2022-02-27 DIAGNOSIS — L570 Actinic keratosis: Secondary | ICD-10-CM | POA: Diagnosis not present

## 2022-03-05 DIAGNOSIS — J3089 Other allergic rhinitis: Secondary | ICD-10-CM | POA: Diagnosis not present

## 2022-03-05 DIAGNOSIS — J301 Allergic rhinitis due to pollen: Secondary | ICD-10-CM | POA: Diagnosis not present

## 2022-03-05 DIAGNOSIS — J3081 Allergic rhinitis due to animal (cat) (dog) hair and dander: Secondary | ICD-10-CM | POA: Diagnosis not present

## 2022-03-12 DIAGNOSIS — J3089 Other allergic rhinitis: Secondary | ICD-10-CM | POA: Diagnosis not present

## 2022-03-12 DIAGNOSIS — J3081 Allergic rhinitis due to animal (cat) (dog) hair and dander: Secondary | ICD-10-CM | POA: Diagnosis not present

## 2022-03-12 DIAGNOSIS — J301 Allergic rhinitis due to pollen: Secondary | ICD-10-CM | POA: Diagnosis not present

## 2022-03-20 DIAGNOSIS — J301 Allergic rhinitis due to pollen: Secondary | ICD-10-CM | POA: Diagnosis not present

## 2022-03-20 DIAGNOSIS — J3089 Other allergic rhinitis: Secondary | ICD-10-CM | POA: Diagnosis not present

## 2022-03-20 DIAGNOSIS — J3081 Allergic rhinitis due to animal (cat) (dog) hair and dander: Secondary | ICD-10-CM | POA: Diagnosis not present

## 2022-03-26 DIAGNOSIS — J3081 Allergic rhinitis due to animal (cat) (dog) hair and dander: Secondary | ICD-10-CM | POA: Diagnosis not present

## 2022-03-26 DIAGNOSIS — J301 Allergic rhinitis due to pollen: Secondary | ICD-10-CM | POA: Diagnosis not present

## 2022-03-26 DIAGNOSIS — J3089 Other allergic rhinitis: Secondary | ICD-10-CM | POA: Diagnosis not present

## 2022-04-02 DIAGNOSIS — J301 Allergic rhinitis due to pollen: Secondary | ICD-10-CM | POA: Diagnosis not present

## 2022-04-02 DIAGNOSIS — J3089 Other allergic rhinitis: Secondary | ICD-10-CM | POA: Diagnosis not present

## 2022-04-02 DIAGNOSIS — J3081 Allergic rhinitis due to animal (cat) (dog) hair and dander: Secondary | ICD-10-CM | POA: Diagnosis not present

## 2022-04-09 DIAGNOSIS — J3089 Other allergic rhinitis: Secondary | ICD-10-CM | POA: Diagnosis not present

## 2022-04-09 DIAGNOSIS — J301 Allergic rhinitis due to pollen: Secondary | ICD-10-CM | POA: Diagnosis not present

## 2022-04-09 DIAGNOSIS — J3081 Allergic rhinitis due to animal (cat) (dog) hair and dander: Secondary | ICD-10-CM | POA: Diagnosis not present

## 2022-04-10 DIAGNOSIS — R351 Nocturia: Secondary | ICD-10-CM | POA: Diagnosis not present

## 2022-04-10 DIAGNOSIS — N401 Enlarged prostate with lower urinary tract symptoms: Secondary | ICD-10-CM | POA: Diagnosis not present

## 2022-04-16 DIAGNOSIS — J3081 Allergic rhinitis due to animal (cat) (dog) hair and dander: Secondary | ICD-10-CM | POA: Diagnosis not present

## 2022-04-16 DIAGNOSIS — J3089 Other allergic rhinitis: Secondary | ICD-10-CM | POA: Diagnosis not present

## 2022-04-16 DIAGNOSIS — J301 Allergic rhinitis due to pollen: Secondary | ICD-10-CM | POA: Diagnosis not present

## 2022-04-17 DIAGNOSIS — N5201 Erectile dysfunction due to arterial insufficiency: Secondary | ICD-10-CM | POA: Diagnosis not present

## 2022-04-17 DIAGNOSIS — Z8546 Personal history of malignant neoplasm of prostate: Secondary | ICD-10-CM | POA: Diagnosis not present

## 2022-04-17 DIAGNOSIS — R351 Nocturia: Secondary | ICD-10-CM | POA: Diagnosis not present

## 2022-04-17 DIAGNOSIS — N401 Enlarged prostate with lower urinary tract symptoms: Secondary | ICD-10-CM | POA: Diagnosis not present

## 2022-04-26 DIAGNOSIS — J301 Allergic rhinitis due to pollen: Secondary | ICD-10-CM | POA: Diagnosis not present

## 2022-04-26 DIAGNOSIS — J3081 Allergic rhinitis due to animal (cat) (dog) hair and dander: Secondary | ICD-10-CM | POA: Diagnosis not present

## 2022-04-26 DIAGNOSIS — J3089 Other allergic rhinitis: Secondary | ICD-10-CM | POA: Diagnosis not present

## 2022-04-30 DIAGNOSIS — J301 Allergic rhinitis due to pollen: Secondary | ICD-10-CM | POA: Diagnosis not present

## 2022-04-30 DIAGNOSIS — J3081 Allergic rhinitis due to animal (cat) (dog) hair and dander: Secondary | ICD-10-CM | POA: Diagnosis not present

## 2022-04-30 DIAGNOSIS — J3089 Other allergic rhinitis: Secondary | ICD-10-CM | POA: Diagnosis not present

## 2022-05-07 DIAGNOSIS — J301 Allergic rhinitis due to pollen: Secondary | ICD-10-CM | POA: Diagnosis not present

## 2022-05-07 DIAGNOSIS — J3089 Other allergic rhinitis: Secondary | ICD-10-CM | POA: Diagnosis not present

## 2022-05-07 DIAGNOSIS — J3081 Allergic rhinitis due to animal (cat) (dog) hair and dander: Secondary | ICD-10-CM | POA: Diagnosis not present

## 2022-05-14 DIAGNOSIS — J3081 Allergic rhinitis due to animal (cat) (dog) hair and dander: Secondary | ICD-10-CM | POA: Diagnosis not present

## 2022-05-14 DIAGNOSIS — J3089 Other allergic rhinitis: Secondary | ICD-10-CM | POA: Diagnosis not present

## 2022-05-14 DIAGNOSIS — J301 Allergic rhinitis due to pollen: Secondary | ICD-10-CM | POA: Diagnosis not present

## 2022-05-19 IMAGING — DX DG SHOULDER 2+V PORT*R*
1 series · 1 of 1 positions shown · non-contrast
Comparison: None.

CLINICAL DATA: Status post right shoulder arthroplasty.

EXAM:
PORTABLE RIGHT SHOULDER

[shoulder ap]
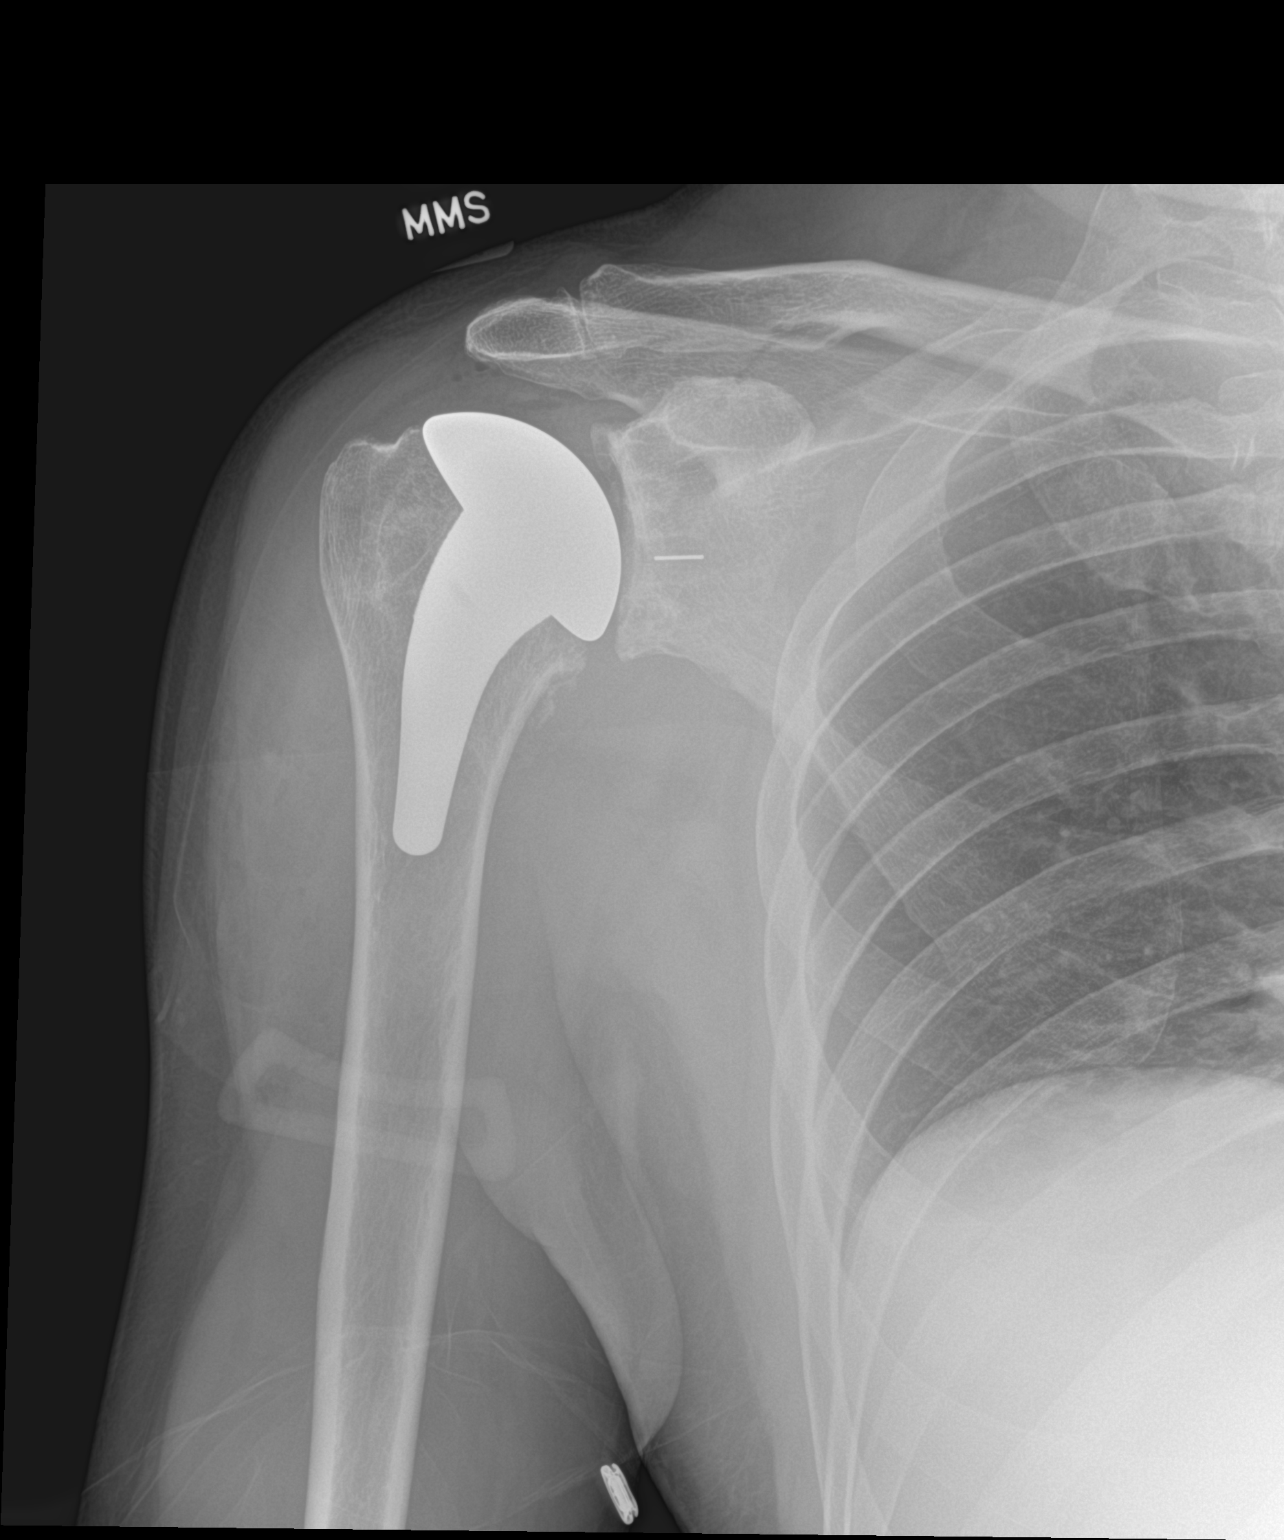

[1 of 1 positions shown; findings below may reference images not displayed]

FINDINGS: The patient is status post right shoulder replacement. Hardware is
in good position. No fracture or dislocation on this single frontal
view. A small amount of air in the soft tissues is likely
postoperative given history.
IMPRESSION: Right shoulder replacement as above.  Hardware is in good position.

## 2022-05-21 DIAGNOSIS — J3089 Other allergic rhinitis: Secondary | ICD-10-CM | POA: Diagnosis not present

## 2022-05-21 DIAGNOSIS — J301 Allergic rhinitis due to pollen: Secondary | ICD-10-CM | POA: Diagnosis not present

## 2022-05-21 DIAGNOSIS — J3081 Allergic rhinitis due to animal (cat) (dog) hair and dander: Secondary | ICD-10-CM | POA: Diagnosis not present

## 2022-05-22 DIAGNOSIS — J301 Allergic rhinitis due to pollen: Secondary | ICD-10-CM | POA: Diagnosis not present

## 2022-05-22 DIAGNOSIS — J3089 Other allergic rhinitis: Secondary | ICD-10-CM | POA: Diagnosis not present

## 2022-05-22 DIAGNOSIS — J3081 Allergic rhinitis due to animal (cat) (dog) hair and dander: Secondary | ICD-10-CM | POA: Diagnosis not present

## 2022-05-28 DIAGNOSIS — J3089 Other allergic rhinitis: Secondary | ICD-10-CM | POA: Diagnosis not present

## 2022-05-28 DIAGNOSIS — J3081 Allergic rhinitis due to animal (cat) (dog) hair and dander: Secondary | ICD-10-CM | POA: Diagnosis not present

## 2022-05-28 DIAGNOSIS — J301 Allergic rhinitis due to pollen: Secondary | ICD-10-CM | POA: Diagnosis not present

## 2022-06-04 DIAGNOSIS — J3081 Allergic rhinitis due to animal (cat) (dog) hair and dander: Secondary | ICD-10-CM | POA: Diagnosis not present

## 2022-06-04 DIAGNOSIS — J3089 Other allergic rhinitis: Secondary | ICD-10-CM | POA: Diagnosis not present

## 2022-06-04 DIAGNOSIS — J301 Allergic rhinitis due to pollen: Secondary | ICD-10-CM | POA: Diagnosis not present

## 2022-06-11 DIAGNOSIS — J301 Allergic rhinitis due to pollen: Secondary | ICD-10-CM | POA: Diagnosis not present

## 2022-06-11 DIAGNOSIS — J3081 Allergic rhinitis due to animal (cat) (dog) hair and dander: Secondary | ICD-10-CM | POA: Diagnosis not present

## 2022-06-11 DIAGNOSIS — J3089 Other allergic rhinitis: Secondary | ICD-10-CM | POA: Diagnosis not present

## 2022-06-13 DIAGNOSIS — K5730 Diverticulosis of large intestine without perforation or abscess without bleeding: Secondary | ICD-10-CM | POA: Diagnosis not present

## 2022-06-13 DIAGNOSIS — Z09 Encounter for follow-up examination after completed treatment for conditions other than malignant neoplasm: Secondary | ICD-10-CM | POA: Diagnosis not present

## 2022-06-13 DIAGNOSIS — K648 Other hemorrhoids: Secondary | ICD-10-CM | POA: Diagnosis not present

## 2022-06-13 DIAGNOSIS — D122 Benign neoplasm of ascending colon: Secondary | ICD-10-CM | POA: Diagnosis not present

## 2022-06-13 DIAGNOSIS — Z86010 Personal history of colonic polyps: Secondary | ICD-10-CM | POA: Diagnosis not present

## 2022-06-18 DIAGNOSIS — J3089 Other allergic rhinitis: Secondary | ICD-10-CM | POA: Diagnosis not present

## 2022-06-18 DIAGNOSIS — J301 Allergic rhinitis due to pollen: Secondary | ICD-10-CM | POA: Diagnosis not present

## 2022-06-18 DIAGNOSIS — J3081 Allergic rhinitis due to animal (cat) (dog) hair and dander: Secondary | ICD-10-CM | POA: Diagnosis not present

## 2022-06-25 DIAGNOSIS — Z0000 Encounter for general adult medical examination without abnormal findings: Secondary | ICD-10-CM | POA: Diagnosis not present

## 2022-06-25 DIAGNOSIS — I10 Essential (primary) hypertension: Secondary | ICD-10-CM | POA: Diagnosis not present

## 2022-06-25 DIAGNOSIS — E782 Mixed hyperlipidemia: Secondary | ICD-10-CM | POA: Diagnosis not present

## 2022-06-28 DIAGNOSIS — J3081 Allergic rhinitis due to animal (cat) (dog) hair and dander: Secondary | ICD-10-CM | POA: Diagnosis not present

## 2022-06-28 DIAGNOSIS — J3089 Other allergic rhinitis: Secondary | ICD-10-CM | POA: Diagnosis not present

## 2022-06-28 DIAGNOSIS — J301 Allergic rhinitis due to pollen: Secondary | ICD-10-CM | POA: Diagnosis not present

## 2022-07-02 DIAGNOSIS — J301 Allergic rhinitis due to pollen: Secondary | ICD-10-CM | POA: Diagnosis not present

## 2022-07-02 DIAGNOSIS — J3081 Allergic rhinitis due to animal (cat) (dog) hair and dander: Secondary | ICD-10-CM | POA: Diagnosis not present

## 2022-07-02 DIAGNOSIS — J3089 Other allergic rhinitis: Secondary | ICD-10-CM | POA: Diagnosis not present

## 2022-07-09 DIAGNOSIS — J301 Allergic rhinitis due to pollen: Secondary | ICD-10-CM | POA: Diagnosis not present

## 2022-07-09 DIAGNOSIS — J3081 Allergic rhinitis due to animal (cat) (dog) hair and dander: Secondary | ICD-10-CM | POA: Diagnosis not present

## 2022-07-09 DIAGNOSIS — J3089 Other allergic rhinitis: Secondary | ICD-10-CM | POA: Diagnosis not present

## 2022-07-16 DIAGNOSIS — J3081 Allergic rhinitis due to animal (cat) (dog) hair and dander: Secondary | ICD-10-CM | POA: Diagnosis not present

## 2022-07-16 DIAGNOSIS — J301 Allergic rhinitis due to pollen: Secondary | ICD-10-CM | POA: Diagnosis not present

## 2022-07-16 DIAGNOSIS — J3089 Other allergic rhinitis: Secondary | ICD-10-CM | POA: Diagnosis not present

## 2022-07-22 DIAGNOSIS — J301 Allergic rhinitis due to pollen: Secondary | ICD-10-CM | POA: Diagnosis not present

## 2022-07-23 DIAGNOSIS — J3089 Other allergic rhinitis: Secondary | ICD-10-CM | POA: Diagnosis not present

## 2022-07-23 DIAGNOSIS — J301 Allergic rhinitis due to pollen: Secondary | ICD-10-CM | POA: Diagnosis not present

## 2022-07-23 DIAGNOSIS — J3081 Allergic rhinitis due to animal (cat) (dog) hair and dander: Secondary | ICD-10-CM | POA: Diagnosis not present

## 2022-08-02 DIAGNOSIS — J3089 Other allergic rhinitis: Secondary | ICD-10-CM | POA: Diagnosis not present

## 2022-08-02 DIAGNOSIS — J3081 Allergic rhinitis due to animal (cat) (dog) hair and dander: Secondary | ICD-10-CM | POA: Diagnosis not present

## 2022-08-02 DIAGNOSIS — J301 Allergic rhinitis due to pollen: Secondary | ICD-10-CM | POA: Diagnosis not present

## 2022-08-07 DIAGNOSIS — J301 Allergic rhinitis due to pollen: Secondary | ICD-10-CM | POA: Diagnosis not present

## 2022-08-07 DIAGNOSIS — J3089 Other allergic rhinitis: Secondary | ICD-10-CM | POA: Diagnosis not present

## 2022-08-07 DIAGNOSIS — J3081 Allergic rhinitis due to animal (cat) (dog) hair and dander: Secondary | ICD-10-CM | POA: Diagnosis not present

## 2022-08-08 ENCOUNTER — Other Ambulatory Visit: Payer: Self-pay | Admitting: Cardiovascular Disease

## 2022-08-13 DIAGNOSIS — J3089 Other allergic rhinitis: Secondary | ICD-10-CM | POA: Diagnosis not present

## 2022-08-13 DIAGNOSIS — J3081 Allergic rhinitis due to animal (cat) (dog) hair and dander: Secondary | ICD-10-CM | POA: Diagnosis not present

## 2022-08-13 DIAGNOSIS — J301 Allergic rhinitis due to pollen: Secondary | ICD-10-CM | POA: Diagnosis not present

## 2022-08-20 DIAGNOSIS — J3089 Other allergic rhinitis: Secondary | ICD-10-CM | POA: Diagnosis not present

## 2022-08-20 DIAGNOSIS — J301 Allergic rhinitis due to pollen: Secondary | ICD-10-CM | POA: Diagnosis not present

## 2022-08-20 DIAGNOSIS — J3081 Allergic rhinitis due to animal (cat) (dog) hair and dander: Secondary | ICD-10-CM | POA: Diagnosis not present

## 2022-08-27 DIAGNOSIS — J3081 Allergic rhinitis due to animal (cat) (dog) hair and dander: Secondary | ICD-10-CM | POA: Diagnosis not present

## 2022-08-27 DIAGNOSIS — J3089 Other allergic rhinitis: Secondary | ICD-10-CM | POA: Diagnosis not present

## 2022-08-27 DIAGNOSIS — J301 Allergic rhinitis due to pollen: Secondary | ICD-10-CM | POA: Diagnosis not present

## 2022-08-29 ENCOUNTER — Other Ambulatory Visit: Payer: Self-pay | Admitting: Cardiovascular Disease

## 2022-09-03 DIAGNOSIS — J3081 Allergic rhinitis due to animal (cat) (dog) hair and dander: Secondary | ICD-10-CM | POA: Diagnosis not present

## 2022-09-03 DIAGNOSIS — J3089 Other allergic rhinitis: Secondary | ICD-10-CM | POA: Diagnosis not present

## 2022-09-03 DIAGNOSIS — J301 Allergic rhinitis due to pollen: Secondary | ICD-10-CM | POA: Diagnosis not present

## 2022-09-17 DIAGNOSIS — J3081 Allergic rhinitis due to animal (cat) (dog) hair and dander: Secondary | ICD-10-CM | POA: Diagnosis not present

## 2022-09-17 DIAGNOSIS — J3089 Other allergic rhinitis: Secondary | ICD-10-CM | POA: Diagnosis not present

## 2022-09-17 DIAGNOSIS — J301 Allergic rhinitis due to pollen: Secondary | ICD-10-CM | POA: Diagnosis not present

## 2022-09-22 ENCOUNTER — Other Ambulatory Visit: Payer: Self-pay | Admitting: Cardiovascular Disease

## 2022-09-23 DIAGNOSIS — L821 Other seborrheic keratosis: Secondary | ICD-10-CM | POA: Diagnosis not present

## 2022-09-23 DIAGNOSIS — L570 Actinic keratosis: Secondary | ICD-10-CM | POA: Diagnosis not present

## 2022-09-24 DIAGNOSIS — J301 Allergic rhinitis due to pollen: Secondary | ICD-10-CM | POA: Diagnosis not present

## 2022-09-24 DIAGNOSIS — J3089 Other allergic rhinitis: Secondary | ICD-10-CM | POA: Diagnosis not present

## 2022-09-24 DIAGNOSIS — J3081 Allergic rhinitis due to animal (cat) (dog) hair and dander: Secondary | ICD-10-CM | POA: Diagnosis not present

## 2022-10-01 ENCOUNTER — Other Ambulatory Visit: Payer: Self-pay | Admitting: Cardiovascular Disease

## 2022-10-01 DIAGNOSIS — J301 Allergic rhinitis due to pollen: Secondary | ICD-10-CM | POA: Diagnosis not present

## 2022-10-01 DIAGNOSIS — J3089 Other allergic rhinitis: Secondary | ICD-10-CM | POA: Diagnosis not present

## 2022-10-01 DIAGNOSIS — J3081 Allergic rhinitis due to animal (cat) (dog) hair and dander: Secondary | ICD-10-CM | POA: Diagnosis not present

## 2022-10-02 DIAGNOSIS — N401 Enlarged prostate with lower urinary tract symptoms: Secondary | ICD-10-CM | POA: Diagnosis not present

## 2022-10-02 DIAGNOSIS — R351 Nocturia: Secondary | ICD-10-CM | POA: Diagnosis not present

## 2022-10-08 DIAGNOSIS — J3089 Other allergic rhinitis: Secondary | ICD-10-CM | POA: Diagnosis not present

## 2022-10-08 DIAGNOSIS — J3081 Allergic rhinitis due to animal (cat) (dog) hair and dander: Secondary | ICD-10-CM | POA: Diagnosis not present

## 2022-10-08 DIAGNOSIS — J301 Allergic rhinitis due to pollen: Secondary | ICD-10-CM | POA: Diagnosis not present

## 2022-10-09 DIAGNOSIS — R351 Nocturia: Secondary | ICD-10-CM | POA: Diagnosis not present

## 2022-10-09 DIAGNOSIS — N5201 Erectile dysfunction due to arterial insufficiency: Secondary | ICD-10-CM | POA: Diagnosis not present

## 2022-10-09 DIAGNOSIS — N401 Enlarged prostate with lower urinary tract symptoms: Secondary | ICD-10-CM | POA: Diagnosis not present

## 2022-10-09 DIAGNOSIS — Z8546 Personal history of malignant neoplasm of prostate: Secondary | ICD-10-CM | POA: Diagnosis not present

## 2022-10-15 ENCOUNTER — Telehealth: Payer: Self-pay | Admitting: Cardiovascular Disease

## 2022-10-15 DIAGNOSIS — J3081 Allergic rhinitis due to animal (cat) (dog) hair and dander: Secondary | ICD-10-CM | POA: Diagnosis not present

## 2022-10-15 DIAGNOSIS — J301 Allergic rhinitis due to pollen: Secondary | ICD-10-CM | POA: Diagnosis not present

## 2022-10-15 DIAGNOSIS — J3089 Other allergic rhinitis: Secondary | ICD-10-CM | POA: Diagnosis not present

## 2022-10-15 MED ORDER — ATORVASTATIN CALCIUM 40 MG PO TABS: 40.0000 mg | ORAL_TABLET | Freq: Every day | ORAL | 3 refills | Status: DC

## 2022-10-15 NOTE — Telephone Encounter (Signed)
*  STAT* If patient is at the pharmacy, call can be transferred to refill team.   1. Which medications need to be refilled? (please list name of each medication and dose if known)   atorvastatin (LIPITOR) 40 MG tablet   2. Which pharmacy/location (including street and city if local pharmacy) is medication to be sent to?  [place], [place] - [address] AT [place]   3. Do they need a 30 day or 90 day supply?   90 day  Patient stated he has been out of this medication for the last 10 day.  Patient stated he has been renovating and lost his medication.  Patient has appointment scheduled on 1/30.

## 2022-10-23 DIAGNOSIS — J3081 Allergic rhinitis due to animal (cat) (dog) hair and dander: Secondary | ICD-10-CM | POA: Diagnosis not present

## 2022-10-23 DIAGNOSIS — J3089 Other allergic rhinitis: Secondary | ICD-10-CM | POA: Diagnosis not present

## 2022-10-23 DIAGNOSIS — J301 Allergic rhinitis due to pollen: Secondary | ICD-10-CM | POA: Diagnosis not present

## 2022-10-29 DIAGNOSIS — J3089 Other allergic rhinitis: Secondary | ICD-10-CM | POA: Diagnosis not present

## 2022-10-29 DIAGNOSIS — J3081 Allergic rhinitis due to animal (cat) (dog) hair and dander: Secondary | ICD-10-CM | POA: Diagnosis not present

## 2022-10-29 DIAGNOSIS — J301 Allergic rhinitis due to pollen: Secondary | ICD-10-CM | POA: Diagnosis not present

## 2022-11-05 DIAGNOSIS — J301 Allergic rhinitis due to pollen: Secondary | ICD-10-CM | POA: Diagnosis not present

## 2022-11-05 DIAGNOSIS — J3089 Other allergic rhinitis: Secondary | ICD-10-CM | POA: Diagnosis not present

## 2022-11-05 DIAGNOSIS — J3081 Allergic rhinitis due to animal (cat) (dog) hair and dander: Secondary | ICD-10-CM | POA: Diagnosis not present

## 2022-11-12 DIAGNOSIS — J3089 Other allergic rhinitis: Secondary | ICD-10-CM | POA: Diagnosis not present

## 2022-11-12 DIAGNOSIS — J301 Allergic rhinitis due to pollen: Secondary | ICD-10-CM | POA: Diagnosis not present

## 2022-11-12 DIAGNOSIS — J3081 Allergic rhinitis due to animal (cat) (dog) hair and dander: Secondary | ICD-10-CM | POA: Diagnosis not present

## 2022-11-19 ENCOUNTER — Encounter: Payer: Self-pay | Admitting: Nurse Practitioner

## 2022-11-19 ENCOUNTER — Ambulatory Visit: Payer: BC Managed Care – PPO | Attending: Nurse Practitioner | Admitting: Nurse Practitioner

## 2022-11-19 VITALS — BP 136/80 | HR 57 | Ht 66.5 in | Wt 171.6 lb

## 2022-11-19 DIAGNOSIS — I10 Essential (primary) hypertension: Secondary | ICD-10-CM

## 2022-11-19 DIAGNOSIS — J301 Allergic rhinitis due to pollen: Secondary | ICD-10-CM | POA: Diagnosis not present

## 2022-11-19 DIAGNOSIS — J3081 Allergic rhinitis due to animal (cat) (dog) hair and dander: Secondary | ICD-10-CM | POA: Diagnosis not present

## 2022-11-19 DIAGNOSIS — E785 Hyperlipidemia, unspecified: Secondary | ICD-10-CM

## 2022-11-19 DIAGNOSIS — I2510 Atherosclerotic heart disease of native coronary artery without angina pectoris: Secondary | ICD-10-CM | POA: Diagnosis not present

## 2022-11-19 DIAGNOSIS — R002 Palpitations: Secondary | ICD-10-CM | POA: Diagnosis not present

## 2022-11-19 DIAGNOSIS — J3089 Other allergic rhinitis: Secondary | ICD-10-CM | POA: Diagnosis not present

## 2022-11-19 NOTE — Patient Instructions (Signed)
Medication Instructions:  Your physician recommends that you continue on your current medications as directed. Please refer to the Current Medication list given to you today.  *If you need a refill on your cardiac medications before your next appointment, please call your pharmacy*   Lab Work: NONE ordered at this time of appointment   If you have labs (blood work) drawn today and your tests are completely normal, you will receive your results only by: [place] (if you have MyChart) OR A paper copy in the mail If you have any lab test that is abnormal or we need to change your treatment, we will call you to review the results.   Testing/Procedures: NONE ordered at this time of appointment     Follow-Up: At [facility], you and your health needs are our priority.  As part of our continuing mission to provide you with exceptional heart care, we have created designated Provider Care Teams.  These Care Teams include your primary Cardiologist (physician) and Advanced Practice Providers (APPs -  Physician Assistants and Nurse Practitioners) who all work together to provide you with the care you need, when you need it.  We recommend signing up for the patient portal called "MyChart".  Sign up information is provided on this After Visit Summary.  MyChart is used to connect with patients for Virtual Visits (Telemedicine).  Patients are able to view lab/test results, encounter notes, upcoming appointments, etc.  Non-urgent messages can be sent to your provider as well.   To learn more about what you can do with MyChart, go to [email or web address].    Your next appointment:   1 year(s)  Provider:   Evalina Field, MD     Other Instructions

## 2022-11-19 NOTE — Progress Notes (Signed)
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  Past Medical History    Past Medical History:  Diagnosis Date   Arthritis    oa right hip and shoulder   Hyperlipidemia    Hypertension    Irregular heart beat pac no cardiologist   saw cardiology dr Ellwood Sayers in past and released   Prostate cancer [facility])    Prostate cancer [facility])    Past Surgical History:  Procedure Laterality Date   colonscopy     PROSTATE BIOPSY     RADIOACTIVE SEED IMPLANT N/A 01/13/2020   Procedure: RADIOACTIVE SEED IMPLANT/BRACHYTHERAPY IMPLANT;  Surgeon: Irine Seal, MD;  Location: [facility];  Service: Urology;  Laterality: N/A;   SPACE OAR INSTILLATION N/A 01/13/2020   Procedure: SPACE OAR INSTILLATION;  Surgeon: Irine Seal, MD;  Location: [facility];  Service: Urology;  Laterality: N/A;   TOTAL SHOULDER ARTHROPLASTY Right 03/01/2021   Procedure: TOTAL SHOULDER ARTHROPLASTY;  Surgeon: Tania Ade, MD;  Location: [facility];  Service: Orthopedics;  Laterality: Right;   WISDOM TOOTH EXTRACTION      Allergies  Allergies  Allergen Reactions   Other Itching and Other (See Comments)    Cat dander- Wheezing, sneezing, and itchy eyes/nose   Tree Extract Itching and Other (See Comments)    Wheezing, sneezing, and itchy eyes/nose     Labs/Other Studies Reviewed    The following studies were reviewed today: CCTA 06/21/2020 IMPRESSION: 1.  Mild nonobstructive CAD, CADRADS = 2.   2. Coronary calcium score of 344. This was 84th percentile for age and sex matched control.   3. Normal coronary origin with right dominance.  Echo 09/06/2020:   IMPRESSIONS    1. Left ventricular ejection fraction, by estimation, is 60 to 65%. The  left ventricle has normal function. The left ventricle has no regional  wall motion abnormalities. Left ventricular diastolic parameters are  consistent with Grade I diastolic  dysfunction (impaired relaxation).   2. Prominent moderator band. Right ventricular systolic function is  normal. The right ventricular size is normal. There is normal pulmonary  artery systolic pressure. The estimated right ventricular systolic  pressure is 18.5 mmHg.   3. Trivial pericardial effusion. The pericardial effusion is posterior to  the left ventricle.   4. The mitral valve is abnormal. Trivial mitral valve regurgitation.   5. The aortic valve is tricuspid. Aortic valve regurgitation is not  visualized. Mild aortic valve sclerosis is present, with no evidence of  aortic valve stenosis.   6. The inferior vena cava is normal in size with greater than 50%  respiratory variability, suggesting right atrial pressure of 3 mmHg.   Comparison(s): No prior Echocardiogram.    Recent Labs: No results found for requested labs within last 365 days.  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  He was evaluated in August 2021 in the setting of chest pain and palpitations. Cardiac monitor revealed less than 1% PACs and  PVCs.  Coronary CT angiogram showed mild nonobstructive CAD.  He was last seen in the office on 09/04/2020 and was stable from a cardiac standpoint.  He noted intermittent fleeting palpitations.  Echocardiogram in 08/2020 showed EF 60 to 65%, normal LV function, no RWMA, G1 DD, normal RV systolic function with, trivial pericardial effusion, mild aortic  valve sclerosis without evidence of stenosis.  He presents today for follow-up.  Since his last visit he has done well from a cardiac standpoint. He continues to note intermittent fleeting palpitations, he denies any dizziness, presyncope, syncope denies symptoms concerning for angina.  His BP has been well-controlled.  He remains active and exercises several days a week at the gym.  Overall, he reports feeling well.  Home Medications    Current Outpatient Medications  Medication Sig Dispense Refill   atorvastatin (LIPITOR) 40 MG tablet Take 1 tablet (40 mg total) by mouth daily. Schedule an appointment for further refills, final attempt 90 tablet 3   Chlorphen-Pseudoephed-APAP (TYLENOL ALLERGY SINUS PO) Take 1 capsule by mouth every 4 (four) hours as needed (allergies).      losartan-hydrochlorothiazide (HYZAAR) 50-12.5 MG per tablet Take 1 tablet by mouth daily.     Multiple Vitamins-Minerals (CENTRUM SILVER 50+MEN PO) Take 1 tablet by mouth daily.     omeprazole (PRILOSEC) 20 MG capsule      tadalafil (CIALIS) 5 MG tablet      tamsulosin (FLOMAX) 0.4 MG CAPS capsule Take 0.4 mg by mouth daily.     No current facility-administered medications for this visit.     Review of Systems    He denies chest pain, dyspnea, pnd, orthopnea, n, v, dizziness, syncope, edema, weight gain, or early satiety. All other systems reviewed and are otherwise negative except as noted above.   Physical Exam    VS:  BP 136/80   Pulse (!) 57   Ht 5' 6.5" (1.689 m)   Wt 171 lb 9.6 oz (77.8 kg)   SpO2 100%   BMI 27.28 kg/m   GEN: Well nourished, well developed, in no acute distress. HEENT: normal. Neck: Supple, no JVD, carotid bruits, or masses. Cardiac: RRR, no murmurs, rubs, or gallops. No clubbing, cyanosis, edema.  Radials/DP/PT 2+ and equal bilaterally.  Respiratory:  Respirations regular and unlabored, clear to auscultation bilaterally. GI: Soft, nontender, nondistended, BS + x 4. MS: no deformity  or atrophy. Skin: warm and dry, no rash. Neuro:  Strength and sensation are intact. Psych: Normal affect.  Accessory Clinical Findings    ECG personally reviewed by me today -sinus bradycardia, 57 bpm, LAD- no acute changes.   Lab Results  Component Value Date   WBC 4.3 02/15/2021   HGB 14.3 02/15/2021   HCT 41.4 02/15/2021   MCV 91.6 02/15/2021   PLT 216 02/15/2021   Lab Results  Component Value Date   CREATININE 1.13 02/15/2021   BUN 20 02/15/2021   NA 140 02/15/2021   K 3.7 02/15/2021   CL 107 02/15/2021   CO2 26 02/15/2021   Lab Results  Component Value Date   ALT 33 02/15/2021   AST 28 02/15/2021   ALKPHOS 73 02/15/2021   BILITOT 1.0 02/15/2021   No results found for: "CHOL", "HDL", "LDLCALC", "LDLDIRECT", "TRIG", "CHOLHDL"  No results found for: "HGBA1C"  Assessment & Plan    1. Nonobstructive CAD: CCTA in 07/10/2020 showed mild nonobstructive CAD.  Stable with no anginal symptoms.  No indication for ischemic evaluation.  Continue losartan-HCTZ, Lipitor.  2. Palpitations:  Monitor in 2021 revealed less than 1% PACs and PVCs.  He notes intermittent fleeting palpitations, stable overall.  No indication for medical therapy at this time.  3. Hypertension: BP well controlled. Continue current antihypertensive regimen.   4. Hyperlipidemia: LDL was 77 in 06/2022. Monitored per PCP.  Discussed need for ongoing lifestyle modifications with diet and exercise. If LDL remains elevated above goal, may need to consider increasing atorvastatin.   5. Disposition: Follow-up in 1 year with Dr. Audie Box.      Lenna Sciara, NP 11/19/2022, 3:28 PM

## 2022-11-26 DIAGNOSIS — J3081 Allergic rhinitis due to animal (cat) (dog) hair and dander: Secondary | ICD-10-CM | POA: Diagnosis not present

## 2022-11-26 DIAGNOSIS — J301 Allergic rhinitis due to pollen: Secondary | ICD-10-CM | POA: Diagnosis not present

## 2022-11-26 DIAGNOSIS — J3089 Other allergic rhinitis: Secondary | ICD-10-CM | POA: Diagnosis not present

## 2022-12-03 DIAGNOSIS — J3081 Allergic rhinitis due to animal (cat) (dog) hair and dander: Secondary | ICD-10-CM | POA: Diagnosis not present

## 2022-12-03 DIAGNOSIS — J301 Allergic rhinitis due to pollen: Secondary | ICD-10-CM | POA: Diagnosis not present

## 2022-12-03 DIAGNOSIS — J3089 Other allergic rhinitis: Secondary | ICD-10-CM | POA: Diagnosis not present

## 2022-12-05 DIAGNOSIS — J3089 Other allergic rhinitis: Secondary | ICD-10-CM | POA: Diagnosis not present

## 2022-12-05 DIAGNOSIS — J301 Allergic rhinitis due to pollen: Secondary | ICD-10-CM | POA: Diagnosis not present

## 2022-12-05 DIAGNOSIS — J3081 Allergic rhinitis due to animal (cat) (dog) hair and dander: Secondary | ICD-10-CM | POA: Diagnosis not present

## 2022-12-06 DIAGNOSIS — Z23 Encounter for immunization: Secondary | ICD-10-CM | POA: Diagnosis not present

## 2022-12-06 DIAGNOSIS — T148XXA Other injury of unspecified body region, initial encounter: Secondary | ICD-10-CM | POA: Diagnosis not present

## 2022-12-11 DIAGNOSIS — J301 Allergic rhinitis due to pollen: Secondary | ICD-10-CM | POA: Diagnosis not present

## 2022-12-11 DIAGNOSIS — J3081 Allergic rhinitis due to animal (cat) (dog) hair and dander: Secondary | ICD-10-CM | POA: Diagnosis not present

## 2022-12-11 DIAGNOSIS — J3089 Other allergic rhinitis: Secondary | ICD-10-CM | POA: Diagnosis not present

## 2022-12-17 DIAGNOSIS — J3089 Other allergic rhinitis: Secondary | ICD-10-CM | POA: Diagnosis not present

## 2022-12-17 DIAGNOSIS — J3081 Allergic rhinitis due to animal (cat) (dog) hair and dander: Secondary | ICD-10-CM | POA: Diagnosis not present

## 2022-12-17 DIAGNOSIS — J301 Allergic rhinitis due to pollen: Secondary | ICD-10-CM | POA: Diagnosis not present

## 2022-12-24 DIAGNOSIS — J3089 Other allergic rhinitis: Secondary | ICD-10-CM | POA: Diagnosis not present

## 2022-12-24 DIAGNOSIS — J301 Allergic rhinitis due to pollen: Secondary | ICD-10-CM | POA: Diagnosis not present

## 2022-12-30 DIAGNOSIS — L570 Actinic keratosis: Secondary | ICD-10-CM | POA: Diagnosis not present

## 2023-01-01 DIAGNOSIS — J3089 Other allergic rhinitis: Secondary | ICD-10-CM | POA: Diagnosis not present

## 2023-01-01 DIAGNOSIS — J3081 Allergic rhinitis due to animal (cat) (dog) hair and dander: Secondary | ICD-10-CM | POA: Diagnosis not present

## 2023-01-01 DIAGNOSIS — J301 Allergic rhinitis due to pollen: Secondary | ICD-10-CM | POA: Diagnosis not present

## 2023-01-07 DIAGNOSIS — J301 Allergic rhinitis due to pollen: Secondary | ICD-10-CM | POA: Diagnosis not present

## 2023-01-07 DIAGNOSIS — J3081 Allergic rhinitis due to animal (cat) (dog) hair and dander: Secondary | ICD-10-CM | POA: Diagnosis not present

## 2023-01-07 DIAGNOSIS — J3089 Other allergic rhinitis: Secondary | ICD-10-CM | POA: Diagnosis not present

## 2023-01-14 DIAGNOSIS — J301 Allergic rhinitis due to pollen: Secondary | ICD-10-CM | POA: Diagnosis not present

## 2023-01-14 DIAGNOSIS — J3089 Other allergic rhinitis: Secondary | ICD-10-CM | POA: Diagnosis not present

## 2023-01-14 DIAGNOSIS — J3081 Allergic rhinitis due to animal (cat) (dog) hair and dander: Secondary | ICD-10-CM | POA: Diagnosis not present

## 2023-01-21 DIAGNOSIS — J301 Allergic rhinitis due to pollen: Secondary | ICD-10-CM | POA: Diagnosis not present

## 2023-01-21 DIAGNOSIS — J3081 Allergic rhinitis due to animal (cat) (dog) hair and dander: Secondary | ICD-10-CM | POA: Diagnosis not present

## 2023-01-21 DIAGNOSIS — J3089 Other allergic rhinitis: Secondary | ICD-10-CM | POA: Diagnosis not present

## 2023-01-29 DIAGNOSIS — J3081 Allergic rhinitis due to animal (cat) (dog) hair and dander: Secondary | ICD-10-CM | POA: Diagnosis not present

## 2023-01-29 DIAGNOSIS — J3089 Other allergic rhinitis: Secondary | ICD-10-CM | POA: Diagnosis not present

## 2023-01-29 DIAGNOSIS — J301 Allergic rhinitis due to pollen: Secondary | ICD-10-CM | POA: Diagnosis not present

## 2023-02-07 DIAGNOSIS — J3089 Other allergic rhinitis: Secondary | ICD-10-CM | POA: Diagnosis not present

## 2023-02-07 DIAGNOSIS — J301 Allergic rhinitis due to pollen: Secondary | ICD-10-CM | POA: Diagnosis not present

## 2023-02-07 DIAGNOSIS — J3081 Allergic rhinitis due to animal (cat) (dog) hair and dander: Secondary | ICD-10-CM | POA: Diagnosis not present

## 2023-02-11 DIAGNOSIS — J329 Chronic sinusitis, unspecified: Secondary | ICD-10-CM | POA: Diagnosis not present

## 2023-02-25 DIAGNOSIS — J3081 Allergic rhinitis due to animal (cat) (dog) hair and dander: Secondary | ICD-10-CM | POA: Diagnosis not present

## 2023-02-25 DIAGNOSIS — J3089 Other allergic rhinitis: Secondary | ICD-10-CM | POA: Diagnosis not present

## 2023-02-25 DIAGNOSIS — J301 Allergic rhinitis due to pollen: Secondary | ICD-10-CM | POA: Diagnosis not present

## 2023-03-04 DIAGNOSIS — J301 Allergic rhinitis due to pollen: Secondary | ICD-10-CM | POA: Diagnosis not present

## 2023-03-04 DIAGNOSIS — J3081 Allergic rhinitis due to animal (cat) (dog) hair and dander: Secondary | ICD-10-CM | POA: Diagnosis not present

## 2023-03-04 DIAGNOSIS — J3089 Other allergic rhinitis: Secondary | ICD-10-CM | POA: Diagnosis not present

## 2023-03-11 DIAGNOSIS — J3081 Allergic rhinitis due to animal (cat) (dog) hair and dander: Secondary | ICD-10-CM | POA: Diagnosis not present

## 2023-03-11 DIAGNOSIS — J3089 Other allergic rhinitis: Secondary | ICD-10-CM | POA: Diagnosis not present

## 2023-03-11 DIAGNOSIS — J301 Allergic rhinitis due to pollen: Secondary | ICD-10-CM | POA: Diagnosis not present

## 2023-03-18 DIAGNOSIS — J3081 Allergic rhinitis due to animal (cat) (dog) hair and dander: Secondary | ICD-10-CM | POA: Diagnosis not present

## 2023-03-18 DIAGNOSIS — J301 Allergic rhinitis due to pollen: Secondary | ICD-10-CM | POA: Diagnosis not present

## 2023-03-18 DIAGNOSIS — J3089 Other allergic rhinitis: Secondary | ICD-10-CM | POA: Diagnosis not present

## 2023-03-25 DIAGNOSIS — J3089 Other allergic rhinitis: Secondary | ICD-10-CM | POA: Diagnosis not present

## 2023-03-25 DIAGNOSIS — J301 Allergic rhinitis due to pollen: Secondary | ICD-10-CM | POA: Diagnosis not present

## 2023-03-25 DIAGNOSIS — J3081 Allergic rhinitis due to animal (cat) (dog) hair and dander: Secondary | ICD-10-CM | POA: Diagnosis not present

## 2023-04-03 DIAGNOSIS — J3081 Allergic rhinitis due to animal (cat) (dog) hair and dander: Secondary | ICD-10-CM | POA: Diagnosis not present

## 2023-04-03 DIAGNOSIS — J3089 Other allergic rhinitis: Secondary | ICD-10-CM | POA: Diagnosis not present

## 2023-04-03 DIAGNOSIS — N401 Enlarged prostate with lower urinary tract symptoms: Secondary | ICD-10-CM | POA: Diagnosis not present

## 2023-04-03 DIAGNOSIS — R351 Nocturia: Secondary | ICD-10-CM | POA: Diagnosis not present

## 2023-04-03 DIAGNOSIS — J301 Allergic rhinitis due to pollen: Secondary | ICD-10-CM | POA: Diagnosis not present

## 2023-04-08 DIAGNOSIS — J301 Allergic rhinitis due to pollen: Secondary | ICD-10-CM | POA: Diagnosis not present

## 2023-04-08 DIAGNOSIS — J3081 Allergic rhinitis due to animal (cat) (dog) hair and dander: Secondary | ICD-10-CM | POA: Diagnosis not present

## 2023-04-08 DIAGNOSIS — J3089 Other allergic rhinitis: Secondary | ICD-10-CM | POA: Diagnosis not present

## 2023-04-09 DIAGNOSIS — N401 Enlarged prostate with lower urinary tract symptoms: Secondary | ICD-10-CM | POA: Diagnosis not present

## 2023-04-09 DIAGNOSIS — R351 Nocturia: Secondary | ICD-10-CM | POA: Diagnosis not present

## 2023-04-09 DIAGNOSIS — N5201 Erectile dysfunction due to arterial insufficiency: Secondary | ICD-10-CM | POA: Diagnosis not present

## 2023-04-09 DIAGNOSIS — Z8546 Personal history of malignant neoplasm of prostate: Secondary | ICD-10-CM | POA: Diagnosis not present

## 2023-04-11 DIAGNOSIS — J301 Allergic rhinitis due to pollen: Secondary | ICD-10-CM | POA: Diagnosis not present

## 2023-04-14 DIAGNOSIS — J3089 Other allergic rhinitis: Secondary | ICD-10-CM | POA: Diagnosis not present

## 2023-04-14 DIAGNOSIS — J3081 Allergic rhinitis due to animal (cat) (dog) hair and dander: Secondary | ICD-10-CM | POA: Diagnosis not present

## 2023-04-15 DIAGNOSIS — J3089 Other allergic rhinitis: Secondary | ICD-10-CM | POA: Diagnosis not present

## 2023-04-15 DIAGNOSIS — J3081 Allergic rhinitis due to animal (cat) (dog) hair and dander: Secondary | ICD-10-CM | POA: Diagnosis not present

## 2023-04-15 DIAGNOSIS — J301 Allergic rhinitis due to pollen: Secondary | ICD-10-CM | POA: Diagnosis not present

## 2023-04-23 DIAGNOSIS — J301 Allergic rhinitis due to pollen: Secondary | ICD-10-CM | POA: Diagnosis not present

## 2023-04-23 DIAGNOSIS — J3089 Other allergic rhinitis: Secondary | ICD-10-CM | POA: Diagnosis not present

## 2023-04-23 DIAGNOSIS — J3081 Allergic rhinitis due to animal (cat) (dog) hair and dander: Secondary | ICD-10-CM | POA: Diagnosis not present

## 2023-04-30 DIAGNOSIS — J3089 Other allergic rhinitis: Secondary | ICD-10-CM | POA: Diagnosis not present

## 2023-04-30 DIAGNOSIS — J3081 Allergic rhinitis due to animal (cat) (dog) hair and dander: Secondary | ICD-10-CM | POA: Diagnosis not present

## 2023-04-30 DIAGNOSIS — J301 Allergic rhinitis due to pollen: Secondary | ICD-10-CM | POA: Diagnosis not present

## 2023-05-12 DIAGNOSIS — J3081 Allergic rhinitis due to animal (cat) (dog) hair and dander: Secondary | ICD-10-CM | POA: Diagnosis not present

## 2023-05-12 DIAGNOSIS — J3089 Other allergic rhinitis: Secondary | ICD-10-CM | POA: Diagnosis not present

## 2023-05-12 DIAGNOSIS — J301 Allergic rhinitis due to pollen: Secondary | ICD-10-CM | POA: Diagnosis not present

## 2023-05-19 DIAGNOSIS — J3081 Allergic rhinitis due to animal (cat) (dog) hair and dander: Secondary | ICD-10-CM | POA: Diagnosis not present

## 2023-05-19 DIAGNOSIS — J301 Allergic rhinitis due to pollen: Secondary | ICD-10-CM | POA: Diagnosis not present

## 2023-05-19 DIAGNOSIS — J3089 Other allergic rhinitis: Secondary | ICD-10-CM | POA: Diagnosis not present

## 2023-05-27 DIAGNOSIS — J3081 Allergic rhinitis due to animal (cat) (dog) hair and dander: Secondary | ICD-10-CM | POA: Diagnosis not present

## 2023-05-27 DIAGNOSIS — J3089 Other allergic rhinitis: Secondary | ICD-10-CM | POA: Diagnosis not present

## 2023-05-27 DIAGNOSIS — J301 Allergic rhinitis due to pollen: Secondary | ICD-10-CM | POA: Diagnosis not present

## 2023-06-02 DIAGNOSIS — J3081 Allergic rhinitis due to animal (cat) (dog) hair and dander: Secondary | ICD-10-CM | POA: Diagnosis not present

## 2023-06-02 DIAGNOSIS — J301 Allergic rhinitis due to pollen: Secondary | ICD-10-CM | POA: Diagnosis not present

## 2023-06-02 DIAGNOSIS — J3089 Other allergic rhinitis: Secondary | ICD-10-CM | POA: Diagnosis not present

## 2023-06-09 DIAGNOSIS — J3089 Other allergic rhinitis: Secondary | ICD-10-CM | POA: Diagnosis not present

## 2023-06-09 DIAGNOSIS — J301 Allergic rhinitis due to pollen: Secondary | ICD-10-CM | POA: Diagnosis not present

## 2023-06-09 DIAGNOSIS — J3081 Allergic rhinitis due to animal (cat) (dog) hair and dander: Secondary | ICD-10-CM | POA: Diagnosis not present

## 2023-06-17 DIAGNOSIS — J301 Allergic rhinitis due to pollen: Secondary | ICD-10-CM | POA: Diagnosis not present

## 2023-06-17 DIAGNOSIS — J3081 Allergic rhinitis due to animal (cat) (dog) hair and dander: Secondary | ICD-10-CM | POA: Diagnosis not present

## 2023-06-17 DIAGNOSIS — J3089 Other allergic rhinitis: Secondary | ICD-10-CM | POA: Diagnosis not present

## 2023-07-07 DIAGNOSIS — E782 Mixed hyperlipidemia: Secondary | ICD-10-CM | POA: Diagnosis not present

## 2023-07-07 DIAGNOSIS — I10 Essential (primary) hypertension: Secondary | ICD-10-CM | POA: Diagnosis not present

## 2023-07-07 DIAGNOSIS — Z0000 Encounter for general adult medical examination without abnormal findings: Secondary | ICD-10-CM | POA: Diagnosis not present

## 2023-07-07 DIAGNOSIS — Z23 Encounter for immunization: Secondary | ICD-10-CM | POA: Diagnosis not present

## 2023-07-17 ENCOUNTER — Ambulatory Visit: Payer: BC Managed Care – PPO | Admitting: Psychology

## 2023-07-17 DIAGNOSIS — F4321 Adjustment disorder with depressed mood: Secondary | ICD-10-CM | POA: Diagnosis not present
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
## 2023-07-31 ENCOUNTER — Ambulatory Visit: Payer: BC Managed Care – PPO | Admitting: Psychology

## 2023-07-31 DIAGNOSIS — F4321 Adjustment disorder with depressed mood: Secondary | ICD-10-CM

## 2023-07-31 NOTE — Progress Notes (Signed)
[facility] Counselor/Therapist Progress Note  Patient ID: Stephen Ayers, MRN: [id number],    Date: 07/31/2023  Time Spent: 50 mins   start time: 1300   end time: 1350  Treatment Type: Individual Therapy  Reported Symptoms: Pt presents via Caregility video for session and grants consent for it as well; pt understands the limits of virtual session.  Pt states he is in his home with no one else present.  I shared with pt that I am in my office with no one else here.   Mental Status Exam: Appearance:  Casual     Behavior: Appropriate  Motor: Normal  Speech/Language:  Clear and Coherent  Affect: Appropriate  Mood: normal  Thought process: normal  Thought content:   WNL  Sensory/Perceptual disturbances:   WNL  Orientation: oriented to person, place, and time/date  Attention: Good  Concentration: Good  Memory: WNL  Fund of knowledge:  Good  Insight:   Good  Judgment:  Good  Impulse Control: Good   Risk Assessment: Danger to Self:  No Self-injurious Behavior: No Danger to Others: No Duty to Warn:no Physical Aggression / Violence:No  Access to Firearms a concern: No  Gang Involvement:No   Subjective: Pt shares that he and [name] are getting along pretty well; she has met again with her therapist and she felt good about that.  They have been doing lots of things together; they even camped out in the backyard together last weekend.  Pt has also cleaned out the attic as a first step to retirement; they got rid of their old textbooks as well.  Pt shares that his daughter and son-in-law live in [place] and have a 39 month old daughter.  They get to see their granddaughter about every 2-3 wks.  His younger daughter is is about to get married and they will be buying a home in the [place] area.  Pt shares that he talked with [name] about our first session and she was pleased that he had taken that step.  Pt shares that he and [name] have been having lots of meals together.  They  also went to the [employer] football game last weekend with both girls and their families and had a great time.  Pt goes to the gym regularly; [name] enjoys swimming due to her fibromyalgia.  They also are thinking about hiking short distances as well.  [name] was working with [employer] when she developed fibromyalgia but has a physician who is good at treating her needs.  Talked with pt about his feeling about his feelings related to the current political climate; he feels pretty good about his perspective.  Pt shares that he feels really good about where he and [name] are right now in their relationship and he will call the office to schedule a follow up session in the future, if needed.  Interventions: Cognitive Behavioral Therapy  Diagnosis:Adjustment disorder with depressed mood  Plan: Treatment Plan Strengths/Abilities:  Intelligent, Intuitive, Willing to participate in therapy Treatment Preferences:  Outpatient Individual Therapy Statement of Needs:  Patient is to use CBT, mindfulness and coping skills to help manage and/or decrease symptoms associated with their diagnosis. Symptoms:  Depressed/Irritable mood, worry, social withdrawal Problems Addressed:  Depressive thoughts, Sadness, Sleep issues, etc. Long Term Goals:  Pt to reduce overall level, frequency, and intensity of the feelings of depression as evidenced by decreased irritability, negative self talk, and helpless feelings from 6 to 7 days/week to 0 to 1 days/week, per client report,  for at least 3 consecutive months.  Progress: 40% Short Term Goals:  Pt to verbally express understanding of the relationship between feelings of depression and their impact on thinking patterns and behaviors.  Pt to verbalize an understanding of the role that distorted thinking plays in creating fears, excessive worry, and ruminations.  Progress: 40% Target Date:  07/30/2024 Frequency:  Bi-weekly Modality:  Cognitive Behavioral Therapy Interventions by  Therapist:  Therapist will use CBT, Mindfulness exercises, Coping skills and Referrals, as needed by client. Client has verbally approved this treatment plan.  Karie Kirks, [facility]

## 2023-09-01 ENCOUNTER — Other Ambulatory Visit: Payer: Self-pay | Admitting: Cardiovascular Disease

## 2023-09-22 DIAGNOSIS — Z8546 Personal history of malignant neoplasm of prostate: Secondary | ICD-10-CM | POA: Diagnosis not present

## 2023-09-29 DIAGNOSIS — N5201 Erectile dysfunction due to arterial insufficiency: Secondary | ICD-10-CM | POA: Diagnosis not present

## 2023-09-29 DIAGNOSIS — N401 Enlarged prostate with lower urinary tract symptoms: Secondary | ICD-10-CM | POA: Diagnosis not present

## 2023-09-29 DIAGNOSIS — Z8546 Personal history of malignant neoplasm of prostate: Secondary | ICD-10-CM | POA: Diagnosis not present

## 2023-09-29 DIAGNOSIS — R351 Nocturia: Secondary | ICD-10-CM | POA: Diagnosis not present

## 2023-10-23 DIAGNOSIS — L570 Actinic keratosis: Secondary | ICD-10-CM | POA: Diagnosis not present

## 2023-10-29 NOTE — Progress Notes (Signed)
 Cardiology Office Note:  .   Date:  10/31/2023  ID:  Stephen Ayers, DOB Oct 27, 1960, MRN [id number] PCP: Arloa Elsie SAUNDERS, MD  [facility] Providers Cardiologist:  Stephen Ayers Decent, MD {  History of Present Illness: .   Stephen Ayers is a 63 y.o. male with history of CAD who presents for follow-up.   History of Present Illness   Mr. Stephen Ayers, a 50 year old with a history of nonobstructive CAD, hyperlipidemia, and hypertension, presents for a follow-up visit. He reports an increase in the frequency of palpitations, experiencing them about three times a day. These episodes are brief, lasting only seconds, and are not associated with any triggers, lightheadedness, or other symptoms. He is able to alleviate the palpitations by taking a few deep breaths. The patient has a history of premature atrial contractions (PACs) and premature ventricular contractions (PVCs), which were identified on a monitor in 2021. <1% burden.   The patient has recently retired and reports a decrease in stress levels. He maintains an active lifestyle, going to the gym five days a week without any limitations or symptoms of angina. He also reports good sleep quality, monitored with an Apple watch, and adequate hydration. He consumes three mugs of coffee in the morning and a glass of wine at night, neither of which seem to trigger the palpitations.  In addition to his cardiac history, the patient had a total shoulder replacement three years ago. He is currently on atorvastatin , losartan-HCTZ, tadalafil, and tamsulosin, the latter of which he is trying to wean off.          Problem List 1. CAD  -mild CAD (25-49%) in LAD -CAC score 344 (84th percentile) 2. HLD -T chol 131, HDL 57, LDL 60, TG 73 3. HTN 4. PACs/PVCs -<1% burden    ROS: All other ROS reviewed and negative. Pertinent positives noted in the HPI.     Studies Reviewed: SABRA   EKG Interpretation Date/Time:  Friday October 31 2023 09:25:25  EST Ventricular Rate:  63 PR Interval:  172 QRS Duration:  102 QT Interval:  420 QTC Calculation: 429 R Axis:   -41  Text Interpretation: Normal sinus rhythm Left axis deviation Moderate voltage criteria for LVH, may be normal variant ( R in aVL , Cornell product ) Confirmed by Decent Stephen [phone]) on 10/31/2023 9:28:18 AM   Physical Exam:   VS:  BP 130/82 (BP Location: Left Arm, Patient Position: Sitting)   Pulse 63   Ht 5' 6 (1.676 m)   Wt 171 lb 9.6 oz (77.8 kg)   SpO2 98%   BMI 27.70 kg/m    Wt Readings from Last 3 Encounters:  10/31/23 171 lb 9.6 oz (77.8 kg)  11/19/22 171 lb 9.6 oz (77.8 kg)  03/01/21 171 lb 1.2 oz (77.6 kg)    GEN: Well nourished, well developed in no acute distress NECK: No JVD; No carotid bruits CARDIAC: RRR, no murmurs, rubs, gallops RESPIRATORY:  Clear to auscultation without rales, wheezing or rhonchi  ABDOMEN: Soft, non-tender, non-distended EXTREMITIES:  No edema; No deformity  ASSESSMENT AND PLAN: .   Assessment and Plan    Palpitations Increased frequency of palpitations, previously identified as PACs and PVCs. No associated symptoms such as lightheadedness. Episodes are brief and self-resolving. -Order 7-day Zio monitor to reassess rhythm during palpitations. -Advise patient to take Magnesium Glycinate 400mg  daily. -he wants to hold on beta blocker for now.  -discussed conservative approach.   Nonobstructive CAD and Hyperlipidemia LDL at  goal (60) on Atorvastatin  40mg  daily. No symptoms of angina with exercise. -Continue Atorvastatin  40mg  daily. -Start Aspirin  81mg  daily for secondary prevention.              Follow-up: Return in about 1 year (around 10/30/2024).  Time Spent with Patient: I have spent a total of 35 minutes caring for this patient today face to face, ordering and reviewing labs/tests, reviewing prior records/medical history, examining the patient, establishing an assessment and plan, communicating results/findings to  the patient/family, and documenting in the medical record.   Signed, Stephen DASEN. Barbaraann, MD, [facility]  [facility]  [address], [address] [place], [place] [address] [phone]  9:42 AM

## 2023-10-31 ENCOUNTER — Ambulatory Visit: Payer: BC Managed Care – PPO | Attending: Cardiovascular Disease | Admitting: Cardiovascular Disease

## 2023-10-31 ENCOUNTER — Encounter: Payer: Self-pay | Admitting: Cardiovascular Disease

## 2023-10-31 ENCOUNTER — Ambulatory Visit (INDEPENDENT_AMBULATORY_CARE_PROVIDER_SITE_OTHER): Payer: BC Managed Care – PPO

## 2023-10-31 VITALS — BP 130/82 | HR 63 | Ht 66.0 in | Wt 171.6 lb

## 2023-10-31 DIAGNOSIS — E785 Hyperlipidemia, unspecified: Secondary | ICD-10-CM | POA: Diagnosis not present

## 2023-10-31 DIAGNOSIS — R002 Palpitations: Secondary | ICD-10-CM

## 2023-10-31 DIAGNOSIS — I10 Essential (primary) hypertension: Secondary | ICD-10-CM

## 2023-10-31 DIAGNOSIS — I2510 Atherosclerotic heart disease of native coronary artery without angina pectoris: Secondary | ICD-10-CM | POA: Diagnosis not present

## 2023-10-31 DIAGNOSIS — I493 Ventricular premature depolarization: Secondary | ICD-10-CM | POA: Diagnosis not present

## 2023-10-31 MED ORDER — ASPIRIN 81 MG PO TBEC: 81.0000 mg | DELAYED_RELEASE_TABLET | Freq: Every day | ORAL | Status: AC

## 2023-10-31 NOTE — Patient Instructions (Addendum)
 Medication Instructions:  - START Aspirin  81mg  daily  - Start Magnesium Glycinate daily    *If you need a refill on your cardiac medications before your next appointment, please call your pharmacy*   Lab Work: None    If you have labs (blood work) drawn today and your tests are completely normal, you will receive your results only by: MyChart Message (if you have MyChart) OR A paper copy in the mail If you have any lab test that is abnormal or we need to change your treatment, we will call you to review the results.   Testing/Procedures: GEOFFRY HEWS- Long Term Monitor Instructions  Your physician has requested you wear a ZIO patch monitor for 7 days.  This is a single patch monitor. [employer] supplies one patch monitor per enrollment. Additional stickers are not available. Please do not apply patch if you will be having a Nuclear Stress Test,  Echocardiogram, Cardiac CT, MRI, or Chest Xray during the period you would be wearing the  monitor. The patch cannot be worn during these tests. You cannot remove and re-apply the  ZIO XT patch monitor.  Your ZIO patch monitor will be mailed 3 day USPS to your address on file. It may take 3-5 days  to receive your monitor after you have been enrolled.  Once you have received your monitor, please review the enclosed instructions. Your monitor  has already been registered assigning a specific monitor serial # to you.  Billing and Patient Assistance Program Information  We have supplied [employer] with any of your insurance information on file for billing purposes. [employer] offers a sliding scale Patient Assistance Program for patients that do not have  insurance, or whose insurance does not completely cover the cost of the ZIO monitor.  You must apply for the Patient Assistance Program to qualify for this discounted rate.  To apply, please call [employer] at [phone], select option 4, select option 2, ask to apply for  Patient Assistance Program.  Meredeth will ask your household income, and how many people  are in your household. They will quote your out-of-pocket cost based on that information.  [employer] will also be able to set up a 66-month, interest-free payment plan if needed.  Applying the monitor   Shave hair from upper left chest.  Hold abrader disc by orange tab. Rub abrader in 40 strokes over the upper left chest as  indicated in your monitor instructions.  Clean area with 4 enclosed alcohol pads. Let dry.  Apply patch as indicated in monitor instructions. Patch will be placed under collarbone on left  side of chest with arrow pointing upward.  Rub patch adhesive wings for 2 minutes. Remove white label marked 1. Remove the white  label marked 2. Rub patch adhesive wings for 2 additional minutes.  While looking in a mirror, press and release button in center of patch. A small green light will  flash 3-4 times. This will be your only indicator that the monitor has been turned on.  Do not shower for the first 24 hours. You may shower after the first 24 hours.  Press the button if you feel a symptom. You will hear a small click. Record Date, Time and  Symptom in the Patient Logbook.  When you are ready to remove the patch, follow instructions on the last 2 pages of Patient  Logbook. Stick patch monitor onto the last page of Patient Logbook.  Place Patient Logbook in the blue and white box. Use locking  tab on box and tape box closed  securely. The blue and white box has prepaid postage on it. Please place it in the mailbox as  soon as possible. Your physician should have your test results approximately 7 days after the  monitor has been mailed back to [facility].  Call [facility] Customer Care at [phone] if you have questions regarding  your ZIO XT patch monitor. Call them immediately if you see an orange light blinking on your  monitor.  If your monitor falls off in less than 4 days, contact our Monitor  department at [phone].  If your monitor becomes loose or falls off after 4 days call [employer] at [phone] for  suggestions on securing your monitor    Follow-Up: At [facility], you and your health needs are our priority.  As part of our continuing mission to provide you with exceptional heart care, we have created designated Provider Care Teams.  These Care Teams include your primary Cardiologist (physician) and Advanced Practice Providers (APPs -  Physician Assistants and Nurse Practitioners) who all work together to provide you with the care you need, when you need it.  We recommend signing up for the patient portal called MyChart.  Sign up information is provided on this After Visit Summary.  MyChart is used to connect with patients for Virtual Visits (Telemedicine).  Patients are able to view lab/test results, encounter notes, upcoming appointments, etc.  Non-urgent messages can be sent to your provider as well.   To learn more about what you can do with MyChart, go to [email or web address].    Your next appointment:   1 year(s)  The format for your next appointment:   In Person  Provider:   Darryle ONEIDA Decent, MD    Other Instructions

## 2023-10-31 NOTE — Progress Notes (Unsigned)
 Enrolled patient for a 7 day Zio XT monitor to be mailed to patients home.

## 2023-11-04 DIAGNOSIS — R002 Palpitations: Secondary | ICD-10-CM | POA: Diagnosis not present

## 2023-11-20 DIAGNOSIS — R002 Palpitations: Secondary | ICD-10-CM | POA: Diagnosis not present

## 2023-11-24 ENCOUNTER — Other Ambulatory Visit: Payer: Self-pay | Admitting: Cardiovascular Disease

## 2023-11-24 ENCOUNTER — Encounter: Payer: Self-pay | Admitting: Cardiovascular Disease

## 2023-11-24 MED ORDER — METOPROLOL SUCCINATE ER 25 MG PO TB24: 25.0000 mg | ORAL_TABLET | Freq: Every day | ORAL | 2 refills | Status: DC

## 2023-11-24 NOTE — Progress Notes (Signed)
Metoprolol succinate 25 mg daily prescribed for SVT.   Gerri Spore T. Flora Lipps, MD, [facility]  [facility]  [address], [address] [place], [place] [address] [phone]  8:35 PM

## 2023-12-09 ENCOUNTER — Ambulatory Visit: Payer: BC Managed Care – PPO | Admitting: Psychology

## 2023-12-09 DIAGNOSIS — F4321 Adjustment disorder with depressed mood: Secondary | ICD-10-CM

## 2023-12-09 NOTE — Progress Notes (Signed)
  Behavioral Health Counselor/Therapist Progress Note  Patient ID: Stephen Ayers, MRN: [id number],    Date: 12/09/2023  Time Spent: 50 mins   start time: 0800   end time: 0850  Treatment Type: Individual Therapy  Reported Symptoms: Pt presents in person for this session, in the office, granting consent for the session.  Pt brings his wife, Stephen Braun, for the visit.  She grants consent for the session.   Mental Status Exam: Appearance:  Casual     Behavior: Appropriate  Motor: Normal  Speech/Language:  Clear and Coherent  Affect: Appropriate  Mood: normal  Thought process: normal  Thought content:   WNL  Sensory/Perceptual disturbances:   WNL  Orientation: oriented to person, place, and time/date  Attention: Good  Concentration: Good  Memory: WNL  Fund of knowledge:  Good  Insight:   Good  Judgment:  Good  Impulse Control: Good   Risk Assessment: Danger to Self:  No Self-injurious Behavior: No Danger to Others: No Duty to Warn:no Physical Aggression / Violence:No  Access to Firearms a concern: No  Gang Involvement:No   Subjective: Stephen Ayers shares that the last time we met, he felt better about where they were at that time.  Since that time, he believes that Stephen Braun continues to talk with people on line that she does not know and has never met.  Stephen Braun shares, "I agree with his concern about the conversations I am having on line but I am also concerned about conversations that we are not having.  The past two month have been horrible; I have had health issues (fibromyalgia and repeated eye infections; I had my left tear duct removed at [facility] two weeks ago)."  Pt shares that she has had to stop exercising for the past several months and she also had to stop singing in the choir at church because of her eyesight.  Pt shares she is able to see better recently; she was not able to drive for the past two months.  Stephen Ayers continues to enjoy working on cars and helps his friends as well.   Ayers's mom lives independently in [place] and was widowed 18 months ago.  Talked through Ayers's experiences with these online people and she understands Stephen Ayers's concerns.  Appreciated Ayers's participation with today's session.  Encouraged both members of the couple to talk about whether or not they want to schedule a follow up session in the future.  Interventions: Cognitive Behavioral Therapy  Diagnosis:Adjustment disorder with depressed mood  Plan: Treatment Plan Strengths/Abilities:  Intelligent, Intuitive, Willing to participate in therapy Treatment Preferences:  Outpatient Individual Therapy Statement of Needs:  Patient is to use CBT, mindfulness and coping skills to help manage and/or decrease symptoms associated with their diagnosis. Symptoms:  Depressed/Irritable mood, worry, social withdrawal Problems Addressed:  Depressive thoughts, Sadness, Sleep issues, etc. Long Term Goals:  Pt to reduce overall level, frequency, and intensity of the feelings of depression as evidenced by decreased irritability, negative self talk, and helpless feelings from 6 to 7 days/week to 0 to 1 days/week, per client report, for at least 3 consecutive months.  Progress: 40% Short Term Goals:  Pt to verbally express understanding of the relationship between feelings of depression and their impact on thinking patterns and behaviors.  Pt to verbalize an understanding of the role that distorted thinking plays in creating fears, excessive worry, and ruminations.  Progress: 40% Target Date:  07/30/2024 Frequency:  Bi-weekly Modality:  Cognitive Behavioral Therapy Interventions by Therapist:  Therapist will use  CBT, Mindfulness exercises, Coping skills and Referrals, as needed by client. Client has verbally approved this treatment plan.  Karie Kirks, [facility]

## 2023-12-17 DIAGNOSIS — M19012 Primary osteoarthritis, left shoulder: Secondary | ICD-10-CM | POA: Diagnosis not present

## 2024-01-14 DIAGNOSIS — M19012 Primary osteoarthritis, left shoulder: Secondary | ICD-10-CM | POA: Diagnosis not present

## 2024-02-09 DIAGNOSIS — H524 Presbyopia: Secondary | ICD-10-CM | POA: Diagnosis not present

## 2024-02-09 DIAGNOSIS — H43393 Other vitreous opacities, bilateral: Secondary | ICD-10-CM | POA: Diagnosis not present

## 2024-02-25 DIAGNOSIS — H2513 Age-related nuclear cataract, bilateral: Secondary | ICD-10-CM | POA: Diagnosis not present

## 2024-03-12 DIAGNOSIS — H2512 Age-related nuclear cataract, left eye: Secondary | ICD-10-CM | POA: Diagnosis not present

## 2024-03-12 DIAGNOSIS — I10 Essential (primary) hypertension: Secondary | ICD-10-CM | POA: Diagnosis not present

## 2024-03-12 DIAGNOSIS — H25012 Cortical age-related cataract, left eye: Secondary | ICD-10-CM | POA: Diagnosis not present

## 2024-03-12 DIAGNOSIS — H2513 Age-related nuclear cataract, bilateral: Secondary | ICD-10-CM | POA: Diagnosis not present

## 2024-03-22 DIAGNOSIS — Z8546 Personal history of malignant neoplasm of prostate: Secondary | ICD-10-CM | POA: Diagnosis not present

## 2024-03-26 DIAGNOSIS — I10 Essential (primary) hypertension: Secondary | ICD-10-CM | POA: Diagnosis not present

## 2024-03-26 DIAGNOSIS — H2511 Age-related nuclear cataract, right eye: Secondary | ICD-10-CM | POA: Diagnosis not present

## 2024-03-26 DIAGNOSIS — H25011 Cortical age-related cataract, right eye: Secondary | ICD-10-CM | POA: Diagnosis not present

## 2024-03-29 DIAGNOSIS — R351 Nocturia: Secondary | ICD-10-CM | POA: Diagnosis not present

## 2024-03-29 DIAGNOSIS — N401 Enlarged prostate with lower urinary tract symptoms: Secondary | ICD-10-CM | POA: Diagnosis not present

## 2024-03-29 DIAGNOSIS — N5201 Erectile dysfunction due to arterial insufficiency: Secondary | ICD-10-CM | POA: Diagnosis not present

## 2024-03-29 DIAGNOSIS — C61 Malignant neoplasm of prostate: Secondary | ICD-10-CM | POA: Diagnosis not present

## 2024-04-08 DIAGNOSIS — L570 Actinic keratosis: Secondary | ICD-10-CM | POA: Diagnosis not present

## 2024-05-19 ENCOUNTER — Other Ambulatory Visit: Payer: Self-pay | Admitting: Cardiovascular Disease

## 2024-07-02 DIAGNOSIS — Z8546 Personal history of malignant neoplasm of prostate: Secondary | ICD-10-CM | POA: Diagnosis not present

## 2024-07-02 DIAGNOSIS — R9721 Rising PSA following treatment for malignant neoplasm of prostate: Secondary | ICD-10-CM | POA: Diagnosis not present

## 2024-07-15 ENCOUNTER — Other Ambulatory Visit: Payer: Self-pay | Admitting: Cardiovascular Disease

## 2024-07-19 DIAGNOSIS — I2510 Atherosclerotic heart disease of native coronary artery without angina pectoris: Secondary | ICD-10-CM | POA: Diagnosis not present

## 2024-07-19 DIAGNOSIS — G8929 Other chronic pain: Secondary | ICD-10-CM | POA: Diagnosis not present

## 2024-07-19 DIAGNOSIS — I10 Essential (primary) hypertension: Secondary | ICD-10-CM | POA: Diagnosis not present

## 2024-07-19 DIAGNOSIS — M545 Low back pain, unspecified: Secondary | ICD-10-CM | POA: Diagnosis not present

## 2024-07-19 DIAGNOSIS — J3089 Other allergic rhinitis: Secondary | ICD-10-CM | POA: Diagnosis not present

## 2024-07-19 DIAGNOSIS — N401 Enlarged prostate with lower urinary tract symptoms: Secondary | ICD-10-CM | POA: Diagnosis not present

## 2024-07-19 DIAGNOSIS — E782 Mixed hyperlipidemia: Secondary | ICD-10-CM | POA: Diagnosis not present

## 2024-07-19 DIAGNOSIS — Z0000 Encounter for general adult medical examination without abnormal findings: Secondary | ICD-10-CM | POA: Diagnosis not present

## 2024-07-19 DIAGNOSIS — N5237 Erectile dysfunction following prostate ablative therapy: Secondary | ICD-10-CM | POA: Diagnosis not present

## 2024-07-26 ENCOUNTER — Other Ambulatory Visit: Payer: Self-pay

## 2024-07-28 ENCOUNTER — Other Ambulatory Visit: Payer: Self-pay

## 2024-07-28 MED ORDER — ATORVASTATIN CALCIUM 40 MG PO TABS: 40.0000 mg | ORAL_TABLET | Freq: Every day | ORAL | 0 refills | Status: AC

## 2024-08-05 ENCOUNTER — Ambulatory Visit: Admitting: Psychology

## 2024-08-05 DIAGNOSIS — F4321 Adjustment disorder with depressed mood: Secondary | ICD-10-CM

## 2024-08-05 NOTE — Progress Notes (Addendum)
 [facility] [facility] Counselor/Therapist Progress Note  Patient ID: Stephen Ayers, MRN: [id number],    Date: 08/05/2024  Time Spent: 60 mins   start time: 1400   end time: 84999  Treatment Type: Individual Therapy  Reported Symptoms: Pt presents in person for this session, in the office, granting consent for the session.  Pt brings his wife, Stephen Ayers, for the visit.  She grants consent for the session.   Mental Status Exam: Appearance:  Casual     Behavior: Appropriate  Motor: Normal  Speech/Language:  Clear and Coherent  Affect: Appropriate  Mood: normal  Thought process: normal  Thought content:   WNL  Sensory/Perceptual disturbances:   WNL  Orientation: oriented to person, place, and time/date  Attention: Good  Concentration: Good  Memory: WNL  Fund of knowledge:  Good  Insight:   Good  Judgment:  Good  Impulse Control: Good   Risk Assessment: Danger to Self:  No Self-injurious Behavior: No Danger to Others: No Duty to Warn:no Physical Aggression / Violence:No  Access to Firearms a concern: No  Gang Involvement:No   Subjective: Stephen Ayers shares, Things have not been good; I thought we were making progress with our relationship and with rebuilding trust, but we were not.  Her online relationship is still ongoing and I thought we might have to separate.  Stephen Ayers shares that she has developed feelings for this online guy; she believes he is famous, he is 61 yrs younger than Stephen Ayers, and he has homes in [place], [place], and [place].  Stephen Ayers shares that this internet person has asked Stephen Ayers to marry him.  Their daughters are Stephen Ayers (married with a daughter) and Stephen Ayers(engaged to be married 9/26).  Stephen Ayers's mom (8 Ayers) might be able to move to Stephen Ayers's home in [place], [place].  Encouraged Stephen Ayers to end her online relationship and encouraged pts to do something fun for both of them between now and our follow up session in 3 wks.   Interventions: Cognitive Behavioral  Therapy  Diagnosis:Adjustment disorder with depressed mood  Plan: Treatment Plan Strengths/Abilities:  Intelligent, Intuitive, Willing to participate in therapy Treatment Preferences:  Outpatient Individual Therapy Statement of Needs:  Patient is to use CBT, mindfulness and coping skills to help manage and/or decrease symptoms associated with their diagnosis. Symptoms:  Depressed/Irritable mood, worry, social withdrawal Problems Addressed:  Depressive thoughts, Sadness, Sleep issues, etc. Long Term Goals:  Pt to reduce overall level, frequency, and intensity of the feelings of depression as evidenced by decreased irritability, negative self talk, and helpless feelings from 6 to 7 days/week to 0 to 1 days/week, per client report, for at least 3 consecutive months.  Progress: 40% Short Term Goals:  Pt to verbally express understanding of the relationship between feelings of depression and their impact on thinking patterns and behaviors.  Pt to verbalize an understanding of the role that distorted thinking plays in creating fears, excessive worry, and ruminations.  Progress: 40% Target Date:  07/30/2025 Frequency:  Bi-weekly Modality:  Cognitive Behavioral Therapy Interventions by Therapist:  Therapist will use CBT, Mindfulness exercises, Coping skills and Referrals, as needed by client. Client has verbally approved this treatment plan.  Francis KATHEE Macintosh, [facility]

## 2024-08-12 DIAGNOSIS — J069 Acute upper respiratory infection, unspecified: Secondary | ICD-10-CM | POA: Diagnosis not present

## 2024-08-24 ENCOUNTER — Ambulatory Visit (INDEPENDENT_AMBULATORY_CARE_PROVIDER_SITE_OTHER): Admitting: Psychology

## 2024-08-24 DIAGNOSIS — F4321 Adjustment disorder with depressed mood: Secondary | ICD-10-CM

## 2024-08-24 NOTE — Progress Notes (Signed)
 [facility] Counselor/Therapist Progress Note  Patient ID: TRAVARIS KOSH, MRN: [id number],    Date: 08/24/2024  Time Spent: 60 mins   start time: 1300   end time: 1400  Treatment Type: Individual Therapy  Reported Symptoms: Pt presents in person for this session, in the office, granting consent for the session.     Mental Status Exam: Appearance:  Casual     Behavior: Appropriate  Motor: Normal  Speech/Language:  Clear and Coherent  Affect: Appropriate  Mood: normal  Thought process: normal  Thought content:   WNL  Sensory/Perceptual disturbances:   WNL  Orientation: oriented to person, place, and time/date  Attention: Good  Concentration: Good  Memory: WNL  Fund of knowledge:  Good  Insight:   Good  Judgment:  Good  Impulse Control: Good   Risk Assessment: Danger to Self:  No Self-injurious Behavior: No Danger to Others: No Duty to Warn:no Physical Aggression / Violence:No  Access to Firearms a concern: No  Gang Involvement:No   Subjective: Alba shares, [name] is not here today because she has a sinus infection; I am just getting over my own illness.  [name] continues to interact with this 'person' and she refuses to stop her interactions.  Talked with pt about the option of demanding that [name] stop her interactions with this person and allowing pt have access to her phone.  If she is not willing to stop her interactions with this person, pt may want to consider bringing their daughters into the situation and make them aware of what is going with their mom.  Pt continues to stay involved with his car friends and working on vehicles and he enjoys this as a self care activities.  Pt also works out regularly and enjoys that as well as the friends he has at the gym.  Pt shares that [name] does also work out when her physical limitations will allow her to do so.  Their daughters are [name]-34 yo (married with a daughter) and [name]-65 yo( engaged to be married 9/26;  lives in [place]).  [name]'s mom (65 yo) might be able to move to [name]'s home in [place], [place].  Pt shares that he and [name] did have time for one date since our last session, before they both got sick; they saw a movie together.  Encouraged pt to continue to engage with his self care activities and we will meet in 2 weeks for a follow up session; hopefully, [name] will be feeling better and can join us  again then.  Interventions: Cognitive Behavioral Therapy  Diagnosis:Adjustment disorder with depressed mood  Plan: Treatment Plan Strengths/Abilities:  Intelligent, Intuitive, Willing to participate in therapy Treatment Preferences:  Outpatient Individual Therapy Statement of Needs:  Patient is to use CBT, mindfulness and coping skills to help manage and/or decrease symptoms associated with their diagnosis. Symptoms:  Depressed/Irritable mood, worry, social withdrawal Problems Addressed:  Depressive thoughts, Sadness, Sleep issues, etc. Long Term Goals:  Pt to reduce overall level, frequency, and intensity of the feelings of depression as evidenced by decreased irritability, negative self talk, and helpless feelings from 6 to 7 days/week to 0 to 1 days/week, per client report, for at least 3 consecutive months.  Progress: 40% Short Term Goals:  Pt to verbally express understanding of the relationship between feelings of depression and their impact on thinking patterns and behaviors.  Pt to verbalize an understanding of the role that distorted thinking plays in creating fears, excessive worry, and ruminations.  Progress:  40% Target Date:  07/30/2025 Frequency:  Bi-weekly Modality:  Cognitive Behavioral Therapy Interventions by Therapist:  Therapist will use CBT, Mindfulness exercises, Coping skills and Referrals, as needed by client. Client has verbally approved this treatment plan.  Francis KATHEE Macintosh, [facility]

## 2024-09-07 ENCOUNTER — Ambulatory Visit: Admitting: Psychology

## 2024-09-07 DIAGNOSIS — F4321 Adjustment disorder with depressed mood: Secondary | ICD-10-CM | POA: Diagnosis not present

## 2024-09-07 NOTE — Progress Notes (Signed)
 [facility] [facility] Counselor/Therapist Progress Note  Patient ID: Stephen Ayers, MRN: [id number],    Date: 09/07/2024  Time Spent: 58 mins   start time: 1302   end time: 1400  Treatment Type: Individual Therapy  Reported Symptoms: Pt presents in person for this session, in the office, granting consent for the session.     Mental Status Exam: Appearance:  Casual     Behavior: Appropriate  Motor: Normal  Speech/Language:  Clear and Coherent  Affect: Appropriate  Mood: normal  Thought process: normal  Thought content:   WNL  Sensory/Perceptual disturbances:   WNL  Orientation: oriented to person, place, and time/date  Attention: Good  Concentration: Good  Memory: WNL  Fund of knowledge:  Good  Insight:   Good  Judgment:  Good  Impulse Control: Good   Risk Assessment: Danger to Self:  No Self-injurious Behavior: No Danger to Others: No Duty to Warn:no Physical Aggression / Violence:No  Access to Firearms a concern: No  Gang Involvement:No   Subjective: Pts present today for session; Stephen Ayers is feeling better today and has recovered from her sickness.  They lost a cat that they had had for 16 yrs this past week; they are both sad about the loss.  Pts share that they have been talking a great deal since our last session.  Stephen Ayers has still been communicating with the other person but it is much less.  Stephen Ayers has agreed for Stephen Ayers to look at her phone whenever he chooses to do so.  Stephen Ayers shares that she would like to maintain that friendship although she does not know who she has been talking to.  The person has said that he is planning to come to [name] to meet Stephen Ayers as well as Stephen Ayers, their daughters and her mom because he knows how close I am to my family.  Stephen Ayers shares that she realizes that this may be a scam but she does not know for certain.  She shares that she has told him that if does not show up on 12/10, I am finished.  Stephen Ayers is still talking as though she believes this  person will show up on 12/10.  Pts share they have been binge-watching TV shows together, they have been trying to plan dates since they have been sick.  Both have been sick for the better part of the past 2 wks.  Stephen Ayers is looking forward to getting back to the gym; she is reading again and has been playing the piano again as well.  Pts are talking about ways to plan a trip for this coming summer.  Their daughters are Stephen Ayers-34 yo (married with a daughter) and Stephen Ayers-33 yo( engaged to be married 9/26; lives in [place]).  Stephen Ayers's mom (10 yo) might be able to move to Stephen Ayers's home in [place], [place].  Encouraged pt to continue to engage with his self care activities and we will meet in 2 weeks for a follow up session; hopefully, Stephen Ayers will be feeling better and can join us  again then.  Interventions: Cognitive Behavioral Therapy  Diagnosis:Adjustment disorder with depressed mood  Plan: Treatment Plan Strengths/Abilities:  Intelligent, Intuitive, Willing to participate in therapy Treatment Preferences:  Outpatient Individual Therapy Statement of Needs:  Patient is to use CBT, mindfulness and coping skills to help manage and/or decrease symptoms associated with their diagnosis. Symptoms:  Depressed/Irritable mood, worry, social withdrawal Problems Addressed:  Depressive thoughts, Sadness, Sleep issues, etc. Long Term Goals:  Pt to reduce overall level, frequency,  and intensity of the feelings of depression as evidenced by decreased irritability, negative self talk, and helpless feelings from 6 to 7 days/week to 0 to 1 days/week, per client report, for at least 3 consecutive months.  Progress: 40% Short Term Goals:  Pt to verbally express understanding of the relationship between feelings of depression and their impact on thinking patterns and behaviors.  Pt to verbalize an understanding of the role that distorted thinking plays in creating fears, excessive worry, and ruminations.  Progress: 40% Target Date:   07/30/2025 Frequency:  Bi-weekly Modality:  Cognitive Behavioral Therapy Interventions by Therapist:  Therapist will use CBT, Mindfulness exercises, Coping skills and Referrals, as needed by client. Client has verbally approved this treatment plan.  Stephen Ayers, [facility]

## 2024-09-21 ENCOUNTER — Ambulatory Visit: Admitting: Psychology

## 2024-09-21 DIAGNOSIS — F4321 Adjustment disorder with depressed mood: Secondary | ICD-10-CM

## 2024-09-21 NOTE — Progress Notes (Signed)
 [facility] Counselor/Therapist Progress Note  Patient ID: Stephen Ayers, MRN: [id number],    Date: 09/21/2024  Time Spent: 60 mins   start time: 1400   end time: 1500  Treatment Type: Individual Therapy  Reported Symptoms: Pt presents in person for this session, in the office, granting consent for the session.     Mental Status Exam: Appearance:  Casual     Behavior: Appropriate  Motor: Normal  Speech/Language:  Clear and Coherent  Affect: Appropriate  Mood: normal  Thought process: normal  Thought content:   WNL  Sensory/Perceptual disturbances:   WNL  Orientation: oriented to person, place, and time/date  Attention: Good  Concentration: Good  Memory: WNL  Fund of knowledge:  Good  Insight:   Good  Judgment:  Good  Impulse Control: Good   Risk Assessment: Danger to Self:  No Self-injurious Behavior: No Danger to Others: No Duty to Warn:no Physical Aggression / Violence:No  Access to Firearms a concern: No  Gang Involvement:No   Subjective: Pts present today for session; we had a nice Thanksgiving with our daughters and their significant others (husband and fiance).  We had it at [name] and we had a great time together.  [name]'s mom is still considering moving to [place] to live with [name] and [name].  Pts' share they had two dates since our last session; they went to see [employer] and then on Friday after Thanksgiving they went to their favorite winery.  Pts shares they had a bad argument on Sunday because [name] found out that she had contacted the other man ([name]) that Sunday morning.  [name] again asked [name] for money and she refused again.  [name] says that [name] was very angry with her and said some hurtful things to her.  [name] acknowledges his frustration with the situation.  Pts shares that their remaining cat is still grieving the loss of the other cat.  [name] continues to play the piano as a self care activity.  Asked pts to think  about what they are grateful for about their marriage; asked them to bring in their lists to our next session.  [name] was supposed to come to [name] on 12/10 to meet pt; he now claims to have book signings in [place], [place] and that he cannot come.  Pt had previously said that, if he did not come on 12/10, then I am finished.  Pt is now, predictably ambivalent about ending her texting relationship with [name].  Encouraged pt to continue to engage with his self care activities and we will meet in 2 weeks for a follow up session; hopefully, [name] will be feeling better and can join us  again then.  Interventions: Cognitive Behavioral Therapy  Diagnosis:Adjustment disorder with depressed mood  Plan: Treatment Plan Strengths/Abilities:  Intelligent, Intuitive, Willing to participate in therapy Treatment Preferences:  Outpatient Individual Therapy Statement of Needs:  Patient is to use CBT, mindfulness and coping skills to help manage and/or decrease symptoms associated with their diagnosis. Symptoms:  Depressed/Irritable mood, worry, social withdrawal Problems Addressed:  Depressive thoughts, Sadness, Sleep issues, etc. Long Term Goals:  Pt to reduce overall level, frequency, and intensity of the feelings of depression as evidenced by decreased irritability, negative self talk, and helpless feelings from 6 to 7 days/week to 0 to 1 days/week, per client report, for at least 3 consecutive months.  Progress: 40% Short Term Goals:  Pt to verbally express understanding of the relationship between feelings of depression  and their impact on thinking patterns and behaviors.  Pt to verbalize an understanding of the role that distorted thinking plays in creating fears, excessive worry, and ruminations.  Progress: 40% Target Date:  07/30/2025 Frequency:  Bi-weekly Modality:  Cognitive Behavioral Therapy Interventions by Therapist:  Therapist will use CBT, Mindfulness exercises, Coping skills and Referrals, as  needed by client. Client has verbally approved this treatment plan.  Francis KATHEE Macintosh, [facility]

## 2024-09-27 DIAGNOSIS — N5235 Erectile dysfunction following radiation therapy: Secondary | ICD-10-CM | POA: Diagnosis not present

## 2024-09-27 DIAGNOSIS — N401 Enlarged prostate with lower urinary tract symptoms: Secondary | ICD-10-CM | POA: Diagnosis not present

## 2024-09-27 DIAGNOSIS — R351 Nocturia: Secondary | ICD-10-CM | POA: Diagnosis not present

## 2024-09-27 DIAGNOSIS — R9721 Rising PSA following treatment for malignant neoplasm of prostate: Secondary | ICD-10-CM | POA: Diagnosis not present

## 2024-09-27 DIAGNOSIS — Z8546 Personal history of malignant neoplasm of prostate: Secondary | ICD-10-CM | POA: Diagnosis not present

## 2024-10-05 ENCOUNTER — Ambulatory Visit: Admitting: Psychology

## 2024-10-05 DIAGNOSIS — F4321 Adjustment disorder with depressed mood: Secondary | ICD-10-CM

## 2024-10-05 NOTE — Progress Notes (Signed)
 [facility] Counselor/Therapist Progress Note  Patient ID: RISHARD DELANGE, MRN: [id number],    Date: 10/05/2024  Time Spent: 60 mins   start time: 1300   end time: 1400  Treatment Type: Individual Therapy  Reported Symptoms: Pt presents in person for this session, in the office, granting consent for the session.     Mental Status Exam: Appearance:  Casual     Behavior: Appropriate  Motor: Normal  Speech/Language:  Clear and Coherent  Affect: Appropriate  Mood: normal  Thought process: normal  Thought content:   WNL  Sensory/Perceptual disturbances:   WNL  Orientation: oriented to person, place, and time/date  Attention: Good  Concentration: Good  Memory: WNL  Fund of knowledge:  Good  Insight:   Good  Judgment:  Good  Impulse Control: Good   Risk Assessment: Danger to Self:  No Self-injurious Behavior: No Danger to Others: No Duty to Warn:no Physical Aggression / Violence:No  Access to Firearms a concern: No  Gang Involvement:No   Subjective: Pts present today for session; we got through the 10th and that is good.  The man did not show up at our house.  Taichi shares that he would like for all communication for stop and [name] advises that the communication will stop soon.  We talked about how to end it; she is less than concrete with her decision to end it.  Pts shares they did have difficult conversations and they managed those pretty well.  Pts share they are talking about travel plans to [facility] and to [place] among other places.  They are also going to see [place] play [employer] at the [facility] this Sunday as well.  They took [name]'s mom to see the lights at [facility] and they all had a great time.  They are working to remember things they have done in the past that they enjoyed and are trying to get back to those.  [name] shares she is back in their bed and she is glad of that; she does have fibromyalgia and it interrupts her sleep several times  per night.  For Christmas, [name] invited them to come to [place] to see [name] open her gifts on Christmas morning.  [name] and her family Kristan and Quincy-12.65 yo) are coming later on Christmas and [name] and her fiance Prentis) are planning the wedding for 07/09/25.  Asked pts to think about what they are grateful for about their marriage; asked them to bring in their lists to our next session.  Encouraged pt to continue to engage with his self care activities and we will meet in 2 weeks for a follow up session.  Interventions: Cognitive Behavioral Therapy  Diagnosis:Adjustment disorder with depressed mood  Plan: Treatment Plan Strengths/Abilities:  Intelligent, Intuitive, Willing to participate in therapy Treatment Preferences:  Outpatient Individual Therapy Statement of Needs:  Patient is to use CBT, mindfulness and coping skills to help manage and/or decrease symptoms associated with their diagnosis. Symptoms:  Depressed/Irritable mood, worry, social withdrawal Problems Addressed:  Depressive thoughts, Sadness, Sleep issues, etc. Long Term Goals:  Pt to reduce overall level, frequency, and intensity of the feelings of depression as evidenced by decreased irritability, negative self talk, and helpless feelings from 6 to 7 days/week to 0 to 1 days/week, per client report, for at least 3 consecutive months.  Progress: 40% Short Term Goals:  Pt to verbally express understanding of the relationship between feelings of depression and their impact on thinking patterns and behaviors.  Pt to verbalize an understanding of the role that distorted thinking plays in creating fears, excessive worry, and ruminations.  Progress: 40% Target Date:  07/30/2025 Frequency:  Bi-weekly Modality:  Cognitive Behavioral Therapy Interventions by Therapist:  Therapist will use CBT, Mindfulness exercises, Coping skills and Referrals, as needed by client. Client has verbally approved this treatment plan.  Francis KATHEE Macintosh,  [facility]

## 2024-10-19 ENCOUNTER — Ambulatory Visit: Admitting: Psychology

## 2024-10-19 DIAGNOSIS — F4321 Adjustment disorder with depressed mood: Secondary | ICD-10-CM | POA: Diagnosis not present

## 2024-10-19 NOTE — Progress Notes (Signed)
 "  [facility] Counselor/Therapist Progress Note  Patient ID: Stephen Ayers, MRN: [id number],    Date: 10/19/2024  Time Spent: 60 mins   start time: 1400   end time: 1500  Treatment Type: Individual Therapy  Reported Symptoms: Pt presents in person for this session, in the office, granting consent for the session.     Mental Status Exam: Appearance:  Casual     Behavior: Appropriate  Motor: Normal  Speech/Language:  Clear and Coherent  Affect: Appropriate  Mood: normal  Thought process: normal  Thought content:   WNL  Sensory/Perceptual disturbances:   WNL  Orientation: oriented to person, place, and time/date  Attention: Good  Concentration: Good  Memory: WNL  Fund of knowledge:  Good  Insight:   Good  Judgment:  Good  Impulse Control: Good   Risk Assessment: Danger to Self:  No Self-injurious Behavior: No Danger to Others: No Duty to Warn:no Physical Aggression / Violence:No  Access to Firearms a concern: No  Gang Involvement:No   Notified of Retirement  Subjective: Pts present today for session; we had a nice Christmas with the girls and their spouse and fiance.  We celebrated my mom's birthday on 12/23 with the whole family.  Everyone came back on Christmas day and it was a good celebration.  Pts' grand daughter, Stephen Ayers, had a wonderful holiday with everyone.  Pt's share they went to the [facility] and [employer] basketball game as well the Sunday before Christmas.  They share they do not normally go out for New Years.  Pts share that they are getting along better; Stephen Ayers shares, we are smiling more now.  Stephen Ayers shares there is no more regular communication with the person who was stringing her along and she feels good about that.  Stephen Ayers shares she had an issue with low blood pressure since our last session but she is in contact with her doctor; she has been quite fatigued with this situation.  Stephen Ayers and pt have been working on wedding plans; they have reserved  the [employer] in [place] for the wedding (07/09/25).  Stephen Ayers and Stephen Ayers are both doing well in their jobs as are Stephen Ayers.  Pts continue to talk about plans for travelling in the Spring.  Asked pts to think about what they are grateful for about their marriage; asked them to bring in their lists to our next session.  Encouraged pt to continue to engage with his self care activities and we will meet in 3 weeks for a follow up session.  Interventions: Cognitive Behavioral Therapy  Diagnosis:Adjustment disorder with depressed mood  Plan: Treatment Plan Strengths/Abilities:  Intelligent, Intuitive, Willing to participate in therapy Treatment Preferences:  Outpatient Individual Therapy Statement of Needs:  Patient is to use CBT, mindfulness and coping skills to help manage and/or decrease symptoms associated with their diagnosis. Symptoms:  Depressed/Irritable mood, worry, social withdrawal Problems Addressed:  Depressive thoughts, Sadness, Sleep issues, etc. Long Term Goals:  Pt to reduce overall level, frequency, and intensity of the feelings of depression as evidenced by decreased irritability, negative self talk, and helpless feelings from 6 to 7 days/week to 0 to 1 days/week, per client report, for at least 3 consecutive months.  Progress: 40% Short Term Goals:  Pt to verbally express understanding of the relationship between feelings of depression and their impact on thinking patterns and behaviors.  Pt to verbalize an understanding of the role that distorted thinking plays in creating fears, excessive worry, and  ruminations.  Progress: 40% Target Date:  07/30/2025 Frequency:  Bi-weekly Modality:  Cognitive Behavioral Therapy Interventions by Therapist:  Therapist will use CBT, Mindfulness exercises, Coping skills and Referrals, as needed by client. Client has verbally approved this treatment plan.  Francis KATHEE Macintosh, [facility] "

## 2024-10-28 ENCOUNTER — Encounter: Payer: Self-pay | Admitting: Cardiovascular Disease

## 2024-11-11 ENCOUNTER — Ambulatory Visit: Admitting: Psychology

## 2024-11-11 DIAGNOSIS — F4321 Adjustment disorder with depressed mood: Secondary | ICD-10-CM | POA: Diagnosis not present

## 2024-11-11 NOTE — Progress Notes (Signed)
"    [facility] Counselor/Therapist Progress Note  Patient ID: KHUP SAPIA, MRN: [id number],    Date: 11/11/2024  Time Spent: 60 mins   start time: 1400   end time: 1500  Treatment Type: Individual Therapy  Reported Symptoms: Pts present in person for this session, in the office, granting consent for the session.     Mental Status Exam: Appearance:  Casual     Behavior: Appropriate  Motor: Normal  Speech/Language:  Clear and Coherent  Affect: Appropriate  Mood: normal  Thought process: normal  Thought content:   WNL  Sensory/Perceptual disturbances:   WNL  Orientation: oriented to person, place, and time/date  Attention: Good  Concentration: Good  Memory: WNL  Fund of knowledge:  Good  Insight:   Good  Judgment:  Good  Impulse Control: Good   Risk Assessment: Danger to Self:  No Self-injurious Behavior: No Danger to Others: No Duty to Warn:no Physical Aggression / Violence:No  Access to Firearms a concern: No  Gang Involvement:No   Notified of Retirement  Subjective: Pts present today for session; we have been fine since our last session.  Pleasant shares he has not looked at [employer] since our last session, and I want to keep that going.  [name] is frustrated with Rylan's lack of trust but says she understands why he has trust issues.  Talked with [name] about ways to help Aldean trust her more quickly and ways that would not be helpful.  [name] got quiet and was a bit tearful.  [name] seems to be trying to deflect some responsibility in this whole situation.  [name] goes into her family of origin issues with her mom and her sister.  [name] and pt have been working on wedding plans; they have reserved the [employer] in [place] for the wedding (07/09/25).  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  Pts continue to talk about plans for travelling in the Spring.  Encouraged pt to continue to engage with his self care activities and we  will meet in 3 weeks for a follow up session.  Interventions: Cognitive Behavioral Therapy  Diagnosis:Adjustment disorder with depressed mood  Plan: Treatment Plan Strengths/Abilities:  Intelligent, Intuitive, Willing to participate in therapy Treatment Preferences:  Outpatient Individual Therapy Statement of Needs:  Patient is to use CBT, mindfulness and coping skills to help manage and/or decrease symptoms associated with their diagnosis. Symptoms:  Depressed/Irritable mood, worry, social withdrawal Problems Addressed:  Depressive thoughts, Sadness, Sleep issues, etc. Long Term Goals:  Pt to reduce overall level, frequency, and intensity of the feelings of depression as evidenced by decreased irritability, negative self talk, and helpless feelings from 6 to 7 days/week to 0 to 1 days/week, per client report, for at least 3 consecutive months.  Progress: 40% Short Term Goals:  Pt to verbally express understanding of the relationship between feelings of depression and their impact on thinking patterns and behaviors.  Pt to verbalize an understanding of the role that distorted thinking plays in creating fears, excessive worry, and ruminations.  Progress: 40% Target Date:  07/30/2025 Frequency:  Bi-weekly Modality:  Cognitive Behavioral Therapy Interventions by Therapist:  Therapist will use CBT, Mindfulness exercises, Coping skills and Referrals, as needed by client. Client has verbally approved this treatment plan.  Francis KATHEE Macintosh, [facility] "

## 2024-11-16 ENCOUNTER — Encounter: Payer: Self-pay | Admitting: Nurse Practitioner

## 2024-11-16 ENCOUNTER — Ambulatory Visit: Payer: Self-pay | Attending: Nurse Practitioner | Admitting: Nurse Practitioner

## 2024-11-16 VITALS — BP 118/66 | HR 63 | Ht 66.0 in | Wt 173.0 lb

## 2024-11-16 DIAGNOSIS — I2510 Atherosclerotic heart disease of native coronary artery without angina pectoris: Secondary | ICD-10-CM

## 2024-11-16 DIAGNOSIS — I471 Supraventricular tachycardia, unspecified: Secondary | ICD-10-CM

## 2024-11-16 DIAGNOSIS — R002 Palpitations: Secondary | ICD-10-CM | POA: Diagnosis not present

## 2024-11-16 DIAGNOSIS — I10 Essential (primary) hypertension: Secondary | ICD-10-CM

## 2024-11-16 DIAGNOSIS — E785 Hyperlipidemia, unspecified: Secondary | ICD-10-CM | POA: Diagnosis not present

## 2024-11-16 NOTE — Patient Instructions (Signed)
 Medication Instructions:  Your physician recommends that you continue on your current medications as directed. Please refer to the Current Medication list given to you today.  *If you need a refill on your cardiac medications before your next appointment, please call your pharmacy*  Lab Work: NONE ordered at this time of appointment   Testing/Procedures: NONE ordered at this time of appointment   Follow-Up: At [facility], you and your health needs are our priority.  As part of our continuing mission to provide you with exceptional heart care, our providers are all part of one team.  This team includes your primary Cardiologist (physician) and Advanced Practice Providers or APPs (Physician Assistants and Nurse Practitioners) who all work together to provide you with the care you need, when you need it.  Your next appointment:   1 year(s)  Provider:   Oneil Bigness, MD    We recommend signing up for the patient portal called "MyChart".  Sign up information is provided on this After Visit Summary.  MyChart is used to connect with patients for Virtual Visits (Telemedicine).  Patients are able to view lab/test results, encounter notes, upcoming appointments, etc.  Non-urgent messages can be sent to your provider as well.   To learn more about what you can do with MyChart, go to [email or web address].

## 2024-11-16 NOTE — Progress Notes (Signed)
 "  Office Visit    Patient Name: Stephen Ayers Date of Encounter: 11/16/2024  Primary Care Provider:  Arloa Elsie SAUNDERS, MD Primary Cardiologist:  Darryle ONEIDA Decent, MD  Chief Complaint    64 year old male with a history of nonobstructive CAD, palpitations, PSVT, PACs and PVCs, hypertension, hyperlipidemia, and prostate cancer who presents for follow-up related to CAD and palpitations.   Past Medical History    Past Medical History:  Diagnosis Date   Arthritis    oa right hip and shoulder   Coronary artery disease    Hyperlipidemia    Hypertension    Irregular heart beat pac no cardiologist   saw cardiology dr erby in past and released   Prostate cancer [facility])    Prostate cancer [facility])    Past Surgical History:  Procedure Laterality Date   colonscopy     PROSTATE BIOPSY     RADIOACTIVE SEED IMPLANT N/A 01/13/2020   Procedure: RADIOACTIVE SEED IMPLANT/BRACHYTHERAPY IMPLANT;  Surgeon: Watt [name], MD;  Location: [facility];  Service: Urology;  Laterality: N/A;   SPACE OAR INSTILLATION N/A 01/13/2020   Procedure: SPACE OAR INSTILLATION;  Surgeon: Watt [name], MD;  Location: [facility];  Service: Urology;  Laterality: N/A;   TOTAL SHOULDER ARTHROPLASTY Right 03/01/2021   Procedure: TOTAL SHOULDER ARTHROPLASTY;  Surgeon: Dozier Soulier, MD;  Location: [facility];  Service: Orthopedics;  Laterality: Right;   WISDOM TOOTH EXTRACTION      Allergies  Allergies[1]   Labs/Other Studies Reviewed    The following studies were reviewed today:  Cardiac Studies & Procedures   ______________________________________________________________________________________________   STRESS TESTS  EXERCISE TOLERANCE TEST (ETT) 04/11/2015  Interpretation Summary  There was no ST segment deviation noted during stress.  Excellent exercise tolerance with the patient exercising to a workload of 13.7 mets  [facility] treadmill score7  Negative, adequate GXT with the  patient exercising to 114% APMHR and workload of 13.7 mets without chest pain or ECG changes.   ECHOCARDIOGRAM  ECHOCARDIOGRAM COMPLETE 09/06/2020  Narrative ECHOCARDIOGRAM REPORT    Patient Name:   Stephen Ayers Date of Exam: 09/06/2020 Medical Rec #:  [id number]      Height:       66.0 in Accession #:    [id number]     Weight:       175.0 lb Date of Birth:  02/24/1961      BSA:          1.889 m Patient Age:    59 years       BP:           132/76 mmHg Patient Gender: M              HR:           71 bpm. Exam Location:  [address]  Procedure: 2D Echo, 3D Echo, Cardiac Doppler, Color Doppler and Strain Analysis  Indications:    I25.1 CAD  History:        Patient has no prior history of Echocardiogram examinations. CAD; Risk Factors:Hypertension and Dyslipidemia. Prostate cancer.  Sonographer:    Marshia Rea RAMAN, RDCS Referring Phys: [id number] DARRYLE NED O'NEAL  IMPRESSIONS   1. Left ventricular ejection fraction, by estimation, is 60 to 65%. The left ventricle has normal function. The left ventricle has no regional wall motion abnormalities. Left ventricular diastolic parameters are consistent with Grade I diastolic dysfunction (impaired relaxation). 2. Prominent moderator band. Right ventricular systolic function is normal. The  right ventricular size is normal. There is normal pulmonary artery systolic pressure. The estimated right ventricular systolic pressure is 18.4 mmHg. 3. Trivial pericardial effusion. The pericardial effusion is posterior to the left ventricle. 4. The mitral valve is abnormal. Trivial mitral valve regurgitation. 5. The aortic valve is tricuspid. Aortic valve regurgitation is not visualized. Mild aortic valve sclerosis is present, with no evidence of aortic valve stenosis. 6. The inferior vena cava is normal in size with greater than 50% respiratory variability, suggesting right atrial pressure of 3 mmHg.  Comparison(s): No prior  Echocardiogram.  FINDINGS Left Ventricle: Left ventricular ejection fraction, by estimation, is 60 to 65%. The left ventricle has normal function. The left ventricle has no regional wall motion abnormalities. The left ventricular internal cavity size was normal in size. There is no left ventricular hypertrophy. Left ventricular diastolic parameters are consistent with Grade I diastolic dysfunction (impaired relaxation). Indeterminate filling pressures.  Right Ventricle: Prominent moderator band. The right ventricular size is normal. No increase in right ventricular wall thickness. Right ventricular systolic function is normal. There is normal pulmonary artery systolic pressure. The tricuspid regurgitant velocity is 1.96 m/s, and with an assumed right atrial pressure of 3 mmHg, the estimated right ventricular systolic pressure is 18.4 mmHg.  Left Atrium: Left atrial size was normal in size.  Right Atrium: Right atrial size was normal in size.  Pericardium: Trivial pericardial effusion is present. The pericardial effusion is posterior to the left ventricle.  Mitral Valve: The mitral valve is abnormal. There is mild thickening of the mitral valve leaflet(s). Trivial mitral valve regurgitation.  Tricuspid Valve: The tricuspid valve is grossly normal. Tricuspid valve regurgitation is mild.  Aortic Valve: The aortic valve is tricuspid. Aortic valve regurgitation is not visualized. Mild aortic valve sclerosis is present, with no evidence of aortic valve stenosis.  Pulmonic Valve: The pulmonic valve was normal in structure. Pulmonic valve regurgitation is not visualized.  Aorta: The aortic root and ascending aorta are structurally normal, with no evidence of dilitation.  Venous: The inferior vena cava is normal in size with greater than 50% respiratory variability, suggesting right atrial pressure of 3 mmHg.  IAS/Shunts: No atrial level shunt detected by color flow Doppler.   LEFT  VENTRICLE PLAX 2D LVIDd:         4.70 cm  Diastology LVIDs:         3.30 cm  LV e' medial:    6.81 cm/s LV PW:         1.00 cm  LV E/e' medial:  11.3 LV IVS:        0.90 cm  LV e' lateral:   8.68 cm/s LVOT diam:     2.00 cm  LV E/e' lateral: 8.9 LV SV:         69 LV SV Index:   37       2D Longitudinal Strain LVOT Area:     3.14 cm 2D Strain GLS (A2C):   -20.3 % 2D Strain GLS (A3C):   -19.3 % 2D Strain GLS (A4C):   -23.1 % 2D Strain GLS Avg:     -20.9 %  3D Volume EF: 3D EF:        61 % LV EDV:       146 ml LV ESV:       56 ml LV SV:        90 ml  RIGHT VENTRICLE RV Basal diam:  3.20 cm RV S prime:  15.90 cm/s TAPSE (M-mode): 2.3 cm RVSP:           18.4 mmHg  LEFT ATRIUM             Index       RIGHT ATRIUM           Index LA diam:        3.30 cm 1.75 cm/m  RA Pressure: 3.00 mmHg LA Vol (A2C):   48.5 ml 25.67 ml/m RA Area:     13.40 cm LA Vol (A4C):   31.7 ml 16.78 ml/m RA Volume:   28.10 ml  14.87 ml/m LA Biplane Vol: 40.6 ml 21.49 ml/m AORTIC VALVE LVOT Vmax:   113.00 cm/s LVOT Vmean:  60.000 cm/s LVOT VTI:    0.220 m  AORTA Ao Root diam: 3.60 cm Ao Asc diam:  3.30 cm  MITRAL VALVE               TRICUSPID VALVE TR Peak grad:   15.4 mmHg TR Vmax:        196.00 cm/s MV E velocity: 77.10 cm/s  Estimated RAP:  3.00 mmHg MV A velocity: 52.30 cm/s  RVSP:           18.4 mmHg MV E/A ratio:  1.47 SHUNTS Systemic VTI:  0.22 m Systemic Diam: 2.00 cm  Vinie Maxcy MD Electronically signed by Vinie Maxcy MD Signature Date/Time: 09/06/2020/6:25:27 PM    Final    MONITORS  LONG TERM MONITOR (3-14 DAYS) 11/20/2023  Narrative Patch Wear Time:  12 days and 2 hours (2025-01-14T14:50:04-499 to 2025-01-26T17:44:34-0500)  Patient had a min HR of 50 bpm (sinus bradycardia), max HR of 158 bpm (supraventricular tachycardia), and avg HR of 75 bpm (normal sinus rhythm). Predominant underlying rhythm was Sinus Rhythm. 7 Supraventricular Tachycardia runs occurred,  the run with the fastest interval lasting 4 beats (1.8 seconds) with a max rate of 156 bpm, the longest lasting 25.9 secs with an avg rate of 114 bpm. Supraventricular Tachycardia was detected within +/- 45 seconds of symptomatic patient event(s). Isolated SVEs were rare (<1.0%), SVE Couplets were rare (<1.0%), and SVE Triplets were rare (<1.0%). Isolated VEs were rare (<1.0%), and no VE Couplets or VE Triplets were present. Ventricular Bigeminy and Trigeminy were present.  Impression: Supraventricular tachycardia detected (7 episodes in 12 days; longest duration 25.9 seconds). Rare ectopy.  Darryle T. Barbaraann, MD, [facility]  [facility] [address] [place], [place] [address] [phone] 7:38 PM   CT SCANS  CT CORONARY MORPH W/CTA COR W/SCORE 06/21/2020  Addendum 06/21/2020  6:35 PM ADDENDUM REPORT: 06/21/2020 18:33  HISTORY: Chest pain/anginal equiv, 16yr CHD risk 10-20%, not treadmill candidate  EXAM: Cardiac/Coronary CT  TECHNIQUE: The patient was scanned on a [employer].  PROTOCOL: A 120 kV prospective scan was triggered in the descending thoracic aorta at 111 HU's. Axial non-contrast 3 mm slices were carried out through the heart. The data set was analyzed on a dedicated work station and scored using the Agatson method. Gantry rotation speed was 250 msecs and collimation was 0.6 mm. Beta blockade and 0.8 mg of sl NTG was given. The 3D data set was reconstructed in 5% intervals of 35-75% of the R-R cycle. Diastolic phases were analyzed on a dedicated work station using MPR, MIP and VRT modes. The patient received 80mL OMNIPAQUE  IOHEXOL  350 MG/ML SOLN of contrast.  FINDINGS: Coronary calcium  score: The patient's coronary artery calcium  score is 344, which places the patient  in the 84th percentile.  Coronary arteries: Normal coronary origins.  Right dominance.  Right Coronary Artery: Normal caliber vessel, gives rise to PDA. Minimal  calcified plaque in proximal to mid RCA with 1-25% stenosis. Distal RCA with scattered calcified plaque and 1-24% stenosis.  Left Main Coronary Artery: Normal caliber vessel. No significant plaque or stenosis.  Left Anterior Descending Coronary Artery: Normal caliber vessel. Mixed calcified and noncalcified plaque in proximal portion as well as mid portion, maximum stenosis 25-49%. Gives rise to large diagonal branch with calcified plaque at the takeoff of D1. Distal LAD wraps apex.  Left Circumflex Artery: Normal caliber vessel. No significant plaque or stenosis. Gives rise to 2 OM branches. OM1 is nearly a ramus intermedius. OM2 is large caliber. After OM2, LCx is trivial in size.  Aorta: Normal size, 33 mm at the mid ascending aorta (level of the PA bifurcation) measured double oblique. No calcifications. No dissection.  Aortic Valve: No calcifications. Trileaflet.  Other findings:  Normal pulmonary vein drainage into the left atrium.  Normal left atrial appendage without a thrombus.  Normal size of the pulmonary artery.  IMPRESSION: 1.  Mild nonobstructive CAD, CADRADS = 2.  2. Coronary calcium  score of 344. This was 84th percentile for age and sex matched control.  3. Normal coronary origin with right dominance.   Electronically Signed By: Shelda Bruckner M.D. On: 06/21/2020 18:33  Narrative EXAM: OVER-READ INTERPRETATION  CT CHEST  The following report is an over-read performed by radiologist Dr. Toribio Aye of [facility] on 06/21/2020. This over-read does not include interpretation of cardiac or coronary anatomy or pathology. The coronary calcium  score/coronary CTA interpretation by the cardiologist is attached.  COMPARISON:  None.  FINDINGS: Within the visualized portions of the thorax there are no suspicious appearing pulmonary nodules or masses, there is no acute consolidative airspace disease, no pleural effusions,  no pneumothorax and no lymphadenopathy. Visualized portions of the upper abdomen are unremarkable. There are no aggressive appearing lytic or blastic lesions noted in the visualized portions of the skeleton.  IMPRESSION: No significant incidental noncardiac findings are noted.  Electronically Signed: By: Toribio Aye M.D. On: 06/21/2020 15:45     ______________________________________________________________________________________________     Recent Labs: No results found for requested labs within last 365 days.  Recent Lipid Panel No results found for: CHOL, TRIG, HDL, CHOLHDL, VLDL, LDLCALC, LDLDIRECT  History of Present Illness    64 year old male with the above past medical history including nonobstructive CAD on CCTA, palpitations, PSVT, PACs and PVCs, hypertension, hyperlipidemia, and prostate cancer.   He was evaluated in August 2021 in the setting of chest pain and palpitations. Cardiac monitor revealed less than 1% PACs and PVCs.  Coronary CT angiogram in 9/29021 revealed coronary calcium  score 344 (84th percentile), mild nonobstructive CAD. Echocardiogram in 08/2020 showed EF 60 to 65%, normal LV function, no RWMA, G1 DD, normal RV systolic function with, trivial pericardial effusion, mild aortic valve sclerosis without evidence of stenosis.  He was last seen in the office on 10/31/2023 and reported intermittent palpitations.  Cardiac monitor in 10/2023 revealed predominant normal sinus rhythm, 7 runs of SVT, longest lasting 25.9 seconds, rare PACs and PVCs.   He presents today for follow-up.  Since his last visit he has done well from a cardiac standpoint.  He reports rare fleeting palpitations, denies any associated symptoms.  Denies symptoms concerning for angina.  He is now retired.  He is exercising, doing cardio 5 days a week without  limitations.  Overall, he reports feeling well.  Home Medications    Current Outpatient Medications  Medication Sig  Dispense Refill   atorvastatin  (LIPITOR) 40 MG tablet Take 1 tablet (40 mg total) by mouth daily. 90 tablet 0   Chlorphen-Pseudoephed-APAP (TYLENOL  ALLERGY SINUS PO) Take 1 capsule by mouth every 4 (four) hours as needed (allergies).      losartan-hydrochlorothiazide (HYZAAR) 50-12.5 MG per tablet Take 1 tablet by mouth daily.     metoprolol  succinate (TOPROL -XL) 25 MG 24 hr tablet TAKE 1 TABLET(25 MG) BY MOUTH DAILY 90 tablet 1   Multiple Vitamins-Minerals (CENTRUM SILVER 50+MEN PO) Take 1 tablet by mouth daily.     omeprazole (PRILOSEC) 20 MG capsule      tadalafil (CIALIS) 5 MG tablet      tamsulosin (FLOMAX) 0.4 MG CAPS capsule Take 0.4 mg by mouth daily.     aspirin  EC 81 MG tablet Take 1 tablet (81 mg total) by mouth daily. Swallow whole.     No current facility-administered medications for this visit.     Review of Systems    He denies chest pain, dyspnea, pnd, orthopnea, n, v, dizziness, syncope, edema, weight gain, or early satiety. All other systems reviewed and are otherwise negative except as noted above.   Physical Exam    VS:  BP 118/66 (BP Location: Left Arm, Patient Position: Sitting, Cuff Size: Normal)   Pulse 63   Ht 5' 6 (1.676 m)   Wt 173 lb (78.5 kg)   SpO2 97%   BMI 27.92 kg/m  GEN: Well nourished, well developed, in no acute distress. HEENT: normal. Neck: Supple, no JVD, carotid bruits, or masses. Cardiac: RRR, no murmurs, rubs, or gallops. No clubbing, cyanosis, edema.  Radials/DP/PT 2+ and equal bilaterally.  Respiratory:  Respirations regular and unlabored, clear to auscultation bilaterally. GI: Soft, nontender, nondistended, BS + x 4. MS: no deformity or atrophy. Skin: warm and dry, no rash. Neuro:  Strength and sensation are intact. Psych: Normal affect.  Accessory Clinical Findings    ECG personally reviewed by me today - EKG Interpretation Date/Time:  Tuesday November 16 2024 15:33:11 EST Ventricular Rate:  63 PR Interval:  186 QRS  Duration:  100 QT Interval:  422 QTC Calculation: 431 R Axis:   -33  Text Interpretation: Normal sinus rhythm Left axis deviation Minimal voltage criteria for LVH, may be normal variant ( Cornell product ) When compared with ECG of 31-Oct-2023 09:25, No significant change was found Confirmed by Daneen Perkins ([id number]) on 11/16/2024 3:38:43 PM  - no acute changes.   Lab Results  Component Value Date   WBC 4.3 02/15/2021   HGB 14.3 02/15/2021   HCT 41.4 02/15/2021   MCV 91.6 02/15/2021   PLT 216 02/15/2021   Lab Results  Component Value Date   CREATININE 1.13 02/15/2021   BUN 20 02/15/2021   NA 140 02/15/2021   K 3.7 02/15/2021   CL 107 02/15/2021   CO2 26 02/15/2021   Lab Results  Component Value Date   ALT 33 02/15/2021   AST 28 02/15/2021   ALKPHOS 73 02/15/2021   BILITOT 1.0 02/15/2021   No results found for: CHOL, HDL, LDLCALC, LDLDIRECT, TRIG, CHOLHDL  No results found for: HGBA1C  Assessment & Plan   1. Nonobstructive CAD: CCTA in 07/10/2020 showed mild nonobstructive CAD.  Stable with no anginal symptoms.  No indication for ischemic evaluation.  Continue metoprolol , losartan-HCTZ, Lipitor.   2. Palpitations: Monitor in 2021 revealed less  than 1% PACs and PVCs.  He notes intermittent fleeting palpitations, stable overall.  Repeat cardiac monitor in 10/2023 revealed predominant normal sinus rhythm, 7 runs of SVT, longest lasting 25.9 seconds, rare PACs and PVCs.  He reports occasional fleeting palpitations, no associated symptoms.  Overall stable.  Continue metoprolol .   3. Hypertension: BP well controlled. Continue current antihypertensive regimen.    4. Hyperlipidemia: LDL was 101 in 06/2024, slightly above goal.  Monitored and managed per PCP.  Repeat labs pending per PCP.  Continue ongoing lifestyle modifications with diet and exercise.  Continue Lipitor.  5. Disposition: Follow-up in 1 year, sooner if needed.      Damien JAYSON Braver, NP 11/16/2024, 3:55 PM        [1]  Allergies Allergen Reactions   Other Itching and Other (See Comments)    Cat dander- Wheezing, sneezing, and itchy eyes/nose   Tree Extract Itching and Other (See Comments)    Wheezing, sneezing, and itchy eyes/nose   "
